# Patient Record
Sex: Female | Born: 1940 | Race: Black or African American | Hispanic: No | Smoking: Never smoker
Health system: Southern US, Community
[De-identification: ages and names within clinical notes are randomized; demographics above are authoritative.]

## PROBLEM LIST (undated history)

## (undated) HISTORY — PX: DIAGNOSTIC LAPAROSCOPY: SUR761

---

## 2013-10-16 ENCOUNTER — Inpatient Hospital Stay
Admission: AD | Admit: 2013-10-16 | Discharge: 2013-11-29 | Disposition: A | Discharge status: Other Acute Inpatient Hospital | Payer: Medicare Other | Source: Ambulatory Visit | Attending: Internal Medicine | Admitting: Internal Medicine

## 2013-10-16 ENCOUNTER — Other Ambulatory Visit (HOSPITAL_COMMUNITY): Payer: Medicare Other

## 2013-10-17 LAB — HEPATIC FUNCTION PANEL
ALT: 15 U/L (ref 0–35)
AST: 20 U/L (ref 0–37)
Albumin: 1.2 g/dL — ABNORMAL LOW (ref 3.5–5.2)
Bilirubin, Direct: 0.5 mg/dL — ABNORMAL HIGH (ref 0.0–0.3)
Total Protein: 5.9 g/dL — ABNORMAL LOW (ref 6.0–8.3)

## 2013-10-17 LAB — CBC WITH DIFFERENTIAL/PLATELET
Basophils Absolute: 0 10*3/uL (ref 0.0–0.1)
Basophils Relative: 0 % (ref 0–1)
Eosinophils Relative: 3 % (ref 0–5)
Hemoglobin: 8.4 g/dL — ABNORMAL LOW (ref 12.0–15.0)
Monocytes Absolute: 1 10*3/uL (ref 0.1–1.0)
Monocytes Relative: 7 % (ref 3–12)
Neutrophils Relative %: 79 % — ABNORMAL HIGH (ref 43–77)
Platelets: 213 10*3/uL (ref 150–400)
RBC: 2.81 MIL/uL — ABNORMAL LOW (ref 3.87–5.11)
WBC: 14.8 10*3/uL — ABNORMAL HIGH (ref 4.0–10.5)

## 2013-10-17 LAB — BASIC METABOLIC PANEL
CO2: 19 mEq/L (ref 19–32)
Calcium: 8.3 mg/dL — ABNORMAL LOW (ref 8.4–10.5)
GFR calc non Af Amer: >90 mL/min (ref >90)
Glucose, Bld: 51 mg/dL — ABNORMAL LOW (ref 70–99)
Potassium: 4.4 mEq/L (ref 3.7–5.3)
Sodium: 136 mEq/L — ABNORMAL LOW (ref 137–147)

## 2013-10-17 LAB — TSH: TSH: 2.505 u[IU]/mL (ref 0.350–4.500)

## 2013-10-17 LAB — C-REACTIVE PROTEIN: CRP: 12.8 mg/dL — ABNORMAL HIGH (ref <0.60)

## 2013-10-18 LAB — PTH, INTACT AND CALCIUM
Calcium, Total (PTH): 7.8 mg/dL — ABNORMAL LOW (ref 8.4–10.5)
PTH: 11.4 pg/mL — ABNORMAL LOW (ref 14.0–72.0)

## 2013-10-20 LAB — CBC
HEMATOCRIT: 24.6 % — AB (ref 36.0–46.0)
HEMOGLOBIN: 7.8 g/dL — AB (ref 12.0–15.0)
MCH: 28.3 pg (ref 26.0–34.0)
MCHC: 31.7 g/dL (ref 30.0–36.0)
MCV: 89.1 fL (ref 78.0–100.0)
Platelets: 349 10*3/uL (ref 150–400)
RBC: 2.76 MIL/uL — ABNORMAL LOW (ref 3.87–5.11)
RDW: 15.7 % — ABNORMAL HIGH (ref 11.5–15.5)
WBC: 15.5 10*3/uL — ABNORMAL HIGH (ref 4.0–10.5)

## 2013-10-20 LAB — BASIC METABOLIC PANEL
BUN: 6 mg/dL (ref 6–23)
CO2: 21 mEq/L (ref 19–32)
CREATININE: 0.43 mg/dL — AB (ref 0.50–1.10)
Calcium: 8.1 mg/dL — ABNORMAL LOW (ref 8.4–10.5)
Chloride: 102 mEq/L (ref 96–112)
GFR calc non Af Amer: >90 mL/min (ref >90)
GLUCOSE: 136 mg/dL — AB (ref 70–99)
Potassium: 3.6 mEq/L — ABNORMAL LOW (ref 3.7–5.3)
Sodium: 135 mEq/L — ABNORMAL LOW (ref 137–147)

## 2013-10-22 LAB — CBC
HEMATOCRIT: 24.7 % — AB (ref 36.0–46.0)
Hemoglobin: 7.9 g/dL — ABNORMAL LOW (ref 12.0–15.0)
MCH: 28.9 pg (ref 26.0–34.0)
MCHC: 32 g/dL (ref 30.0–36.0)
MCV: 90.5 fL (ref 78.0–100.0)
Platelets: 407 10*3/uL — ABNORMAL HIGH (ref 150–400)
RBC: 2.73 MIL/uL — ABNORMAL LOW (ref 3.87–5.11)
RDW: 15.9 % — AB (ref 11.5–15.5)
WBC: 14.2 10*3/uL — ABNORMAL HIGH (ref 4.0–10.5)

## 2013-10-24 LAB — COMPREHENSIVE METABOLIC PANEL
ALBUMIN: 1.6 g/dL — AB (ref 3.5–5.2)
ALT: 10 U/L (ref 0–35)
AST: 8 U/L (ref 0–37)
Alkaline Phosphatase: 135 U/L — ABNORMAL HIGH (ref 39–117)
BUN: 10 mg/dL (ref 6–23)
CALCIUM: 8.7 mg/dL (ref 8.4–10.5)
CO2: 22 meq/L (ref 19–32)
CREATININE: 0.44 mg/dL — AB (ref 0.50–1.10)
Chloride: 100 mEq/L (ref 96–112)
GFR calc Af Amer: >90 mL/min (ref >90)
Glucose, Bld: 179 mg/dL — ABNORMAL HIGH (ref 70–99)
Potassium: 3.7 mEq/L (ref 3.7–5.3)
SODIUM: 136 meq/L — AB (ref 137–147)
Total Bilirubin: 0.4 mg/dL (ref 0.3–1.2)
Total Protein: 6.6 g/dL (ref 6.0–8.3)

## 2013-10-24 LAB — CBC
HCT: 27.5 % — ABNORMAL LOW (ref 36.0–46.0)
Hemoglobin: 8.7 g/dL — ABNORMAL LOW (ref 12.0–15.0)
MCH: 28.6 pg (ref 26.0–34.0)
MCHC: 31.6 g/dL (ref 30.0–36.0)
MCV: 90.5 fL (ref 78.0–100.0)
PLATELETS: 478 10*3/uL — AB (ref 150–400)
RBC: 3.04 MIL/uL — AB (ref 3.87–5.11)
RDW: 16 % — ABNORMAL HIGH (ref 11.5–15.5)
WBC: 12.6 10*3/uL — ABNORMAL HIGH (ref 4.0–10.5)

## 2013-10-25 ENCOUNTER — Other Ambulatory Visit (HOSPITAL_COMMUNITY): Payer: Medicare Other

## 2013-10-26 LAB — CBC WITH DIFFERENTIAL/PLATELET
Basophils Absolute: 0 10*3/uL (ref 0.0–0.1)
Basophils Relative: 0 % (ref 0–1)
EOS ABS: 0 10*3/uL (ref 0.0–0.7)
Eosinophils Relative: 0 % (ref 0–5)
HCT: 27.2 % — ABNORMAL LOW (ref 36.0–46.0)
HEMOGLOBIN: 8.8 g/dL — AB (ref 12.0–15.0)
LYMPHS ABS: 1.1 10*3/uL (ref 0.7–4.0)
Lymphocytes Relative: 9 % — ABNORMAL LOW (ref 12–46)
MCH: 29.4 pg (ref 26.0–34.0)
MCHC: 32.4 g/dL (ref 30.0–36.0)
MCV: 91 fL (ref 78.0–100.0)
MONO ABS: 0.4 10*3/uL (ref 0.1–1.0)
MONOS PCT: 3 % (ref 3–12)
Neutro Abs: 10.8 10*3/uL — ABNORMAL HIGH (ref 1.7–7.7)
Neutrophils Relative %: 88 % — ABNORMAL HIGH (ref 43–77)
Platelets: 452 10*3/uL — ABNORMAL HIGH (ref 150–400)
RBC: 2.99 MIL/uL — ABNORMAL LOW (ref 3.87–5.11)
RDW: 16 % — ABNORMAL HIGH (ref 11.5–15.5)
WBC: 12.2 10*3/uL — ABNORMAL HIGH (ref 4.0–10.5)

## 2013-10-26 LAB — BASIC METABOLIC PANEL
BUN: 16 mg/dL (ref 6–23)
CO2: 22 mEq/L (ref 19–32)
CREATININE: 0.45 mg/dL — AB (ref 0.50–1.10)
Calcium: 8.7 mg/dL (ref 8.4–10.5)
Chloride: 100 mEq/L (ref 96–112)
GLUCOSE: 113 mg/dL — AB (ref 70–99)
POTASSIUM: 4.7 meq/L (ref 3.7–5.3)
Sodium: 135 mEq/L — ABNORMAL LOW (ref 137–147)

## 2013-10-28 LAB — CBC
HCT: 33 % — ABNORMAL LOW (ref 36.0–46.0)
HEMOGLOBIN: 10.6 g/dL — AB (ref 12.0–15.0)
MCH: 29 pg (ref 26.0–34.0)
MCHC: 32.1 g/dL (ref 30.0–36.0)
MCV: 90.4 fL (ref 78.0–100.0)
Platelets: 484 10*3/uL — ABNORMAL HIGH (ref 150–400)
RBC: 3.65 MIL/uL — ABNORMAL LOW (ref 3.87–5.11)
RDW: 16.1 % — AB (ref 11.5–15.5)
WBC: 16.8 10*3/uL — ABNORMAL HIGH (ref 4.0–10.5)

## 2013-10-28 LAB — BASIC METABOLIC PANEL
BUN: 16 mg/dL (ref 6–23)
CALCIUM: 8.5 mg/dL (ref 8.4–10.5)
CO2: 22 mEq/L (ref 19–32)
Chloride: 99 mEq/L (ref 96–112)
Creatinine, Ser: 0.44 mg/dL — ABNORMAL LOW (ref 0.50–1.10)
GLUCOSE: 118 mg/dL — AB (ref 70–99)
POTASSIUM: 3.4 meq/L — AB (ref 3.7–5.3)
Sodium: 135 mEq/L — ABNORMAL LOW (ref 137–147)

## 2013-10-28 MED ORDER — IOHEXOL 300 MG/ML  SOLN: 25.0000 mL | Freq: Once | INTRAMUSCULAR | Status: AC | PRN

## 2013-10-29 ENCOUNTER — Other Ambulatory Visit (HOSPITAL_COMMUNITY): Payer: Medicare Other

## 2013-10-29 ENCOUNTER — Encounter: Payer: Self-pay | Admitting: Radiology

## 2013-10-29 LAB — CBC WITH DIFFERENTIAL/PLATELET
BASOS ABS: 0 10*3/uL (ref 0.0–0.1)
BASOS PCT: 0 % (ref 0–1)
EOS PCT: 0 % (ref 0–5)
Eosinophils Absolute: 0 10*3/uL (ref 0.0–0.7)
HCT: 33.1 % — ABNORMAL LOW (ref 36.0–46.0)
Hemoglobin: 10.5 g/dL — ABNORMAL LOW (ref 12.0–15.0)
Lymphocytes Relative: 8 % — ABNORMAL LOW (ref 12–46)
Lymphs Abs: 1.5 10*3/uL (ref 0.7–4.0)
MCH: 29 pg (ref 26.0–34.0)
MCHC: 31.7 g/dL (ref 30.0–36.0)
MCV: 91.4 fL (ref 78.0–100.0)
Monocytes Absolute: 1 10*3/uL (ref 0.1–1.0)
Monocytes Relative: 5 % (ref 3–12)
NEUTROS PCT: 87 % — AB (ref 43–77)
Neutro Abs: 15.7 10*3/uL — ABNORMAL HIGH (ref 1.7–7.7)
Platelets: 451 10*3/uL — ABNORMAL HIGH (ref 150–400)
RBC: 3.62 MIL/uL — ABNORMAL LOW (ref 3.87–5.11)
RDW: 16.5 % — AB (ref 11.5–15.5)
WBC: 18.2 10*3/uL — AB (ref 4.0–10.5)

## 2013-10-29 LAB — POTASSIUM: Potassium: 3.8 mEq/L (ref 3.7–5.3)

## 2013-10-29 LAB — PROCALCITONIN

## 2013-10-29 MED ORDER — IOHEXOL 300 MG/ML  SOLN
80.0000 mL | Freq: Once | INTRAMUSCULAR | Status: AC | PRN
  Administered 2013-10-29: 80 mL via INTRAVENOUS

## 2013-10-30 LAB — COMPREHENSIVE METABOLIC PANEL
ALT: 21 U/L (ref 0–35)
AST: 12 U/L (ref 0–37)
Albumin: 2.1 g/dL — ABNORMAL LOW (ref 3.5–5.2)
Alkaline Phosphatase: 80 U/L (ref 39–117)
BILIRUBIN TOTAL: 0.3 mg/dL (ref 0.3–1.2)
BUN: 15 mg/dL (ref 6–23)
CO2: 22 meq/L (ref 19–32)
CREATININE: 0.42 mg/dL — AB (ref 0.50–1.10)
Calcium: 8.1 mg/dL — ABNORMAL LOW (ref 8.4–10.5)
Chloride: 99 mEq/L (ref 96–112)
GLUCOSE: 128 mg/dL — AB (ref 70–99)
Potassium: 4 mEq/L (ref 3.7–5.3)
Sodium: 134 mEq/L — ABNORMAL LOW (ref 137–147)
Total Protein: 6.2 g/dL (ref 6.0–8.3)

## 2013-10-30 LAB — CBC WITH DIFFERENTIAL/PLATELET
BASOS ABS: 0 10*3/uL (ref 0.0–0.1)
BASOS PCT: 0 % (ref 0–1)
EOS ABS: 0 10*3/uL (ref 0.0–0.7)
Eosinophils Relative: 0 % (ref 0–5)
HCT: 30.1 % — ABNORMAL LOW (ref 36.0–46.0)
Hemoglobin: 9.9 g/dL — ABNORMAL LOW (ref 12.0–15.0)
Lymphocytes Relative: 5 % — ABNORMAL LOW (ref 12–46)
Lymphs Abs: 0.7 10*3/uL (ref 0.7–4.0)
MCH: 29.6 pg (ref 26.0–34.0)
MCHC: 32.9 g/dL (ref 30.0–36.0)
MCV: 90.1 fL (ref 78.0–100.0)
Monocytes Absolute: 0.4 10*3/uL (ref 0.1–1.0)
Monocytes Relative: 3 % (ref 3–12)
Neutro Abs: 13.8 10*3/uL — ABNORMAL HIGH (ref 1.7–7.7)
Neutrophils Relative %: 93 % — ABNORMAL HIGH (ref 43–77)
PLATELETS: 427 10*3/uL — AB (ref 150–400)
RBC: 3.34 MIL/uL — ABNORMAL LOW (ref 3.87–5.11)
RDW: 16.7 % — ABNORMAL HIGH (ref 11.5–15.5)
WBC: 14.9 10*3/uL — ABNORMAL HIGH (ref 4.0–10.5)

## 2013-10-30 LAB — PREALBUMIN: Prealbumin: 27.5 mg/dL (ref 17.0–34.0)

## 2013-11-01 ENCOUNTER — Other Ambulatory Visit (HOSPITAL_COMMUNITY): Payer: Medicare Other

## 2013-11-01 LAB — CBC WITH DIFFERENTIAL/PLATELET
BASOS ABS: 0 10*3/uL (ref 0.0–0.1)
Basophils Relative: 0 % (ref 0–1)
EOS ABS: 0 10*3/uL (ref 0.0–0.7)
Eosinophils Relative: 0 % (ref 0–5)
HCT: 31.8 % — ABNORMAL LOW (ref 36.0–46.0)
Hemoglobin: 10.4 g/dL — ABNORMAL LOW (ref 12.0–15.0)
LYMPHS ABS: 0.8 10*3/uL (ref 0.7–4.0)
Lymphocytes Relative: 4 % — ABNORMAL LOW (ref 12–46)
MCH: 29.5 pg (ref 26.0–34.0)
MCHC: 32.7 g/dL (ref 30.0–36.0)
MCV: 90.3 fL (ref 78.0–100.0)
Monocytes Absolute: 0.8 10*3/uL (ref 0.1–1.0)
Monocytes Relative: 4 % (ref 3–12)
NEUTROS PCT: 92 % — AB (ref 43–77)
Neutro Abs: 17.7 10*3/uL — ABNORMAL HIGH (ref 1.7–7.7)
PLATELETS: 352 10*3/uL (ref 150–400)
RBC: 3.52 MIL/uL — ABNORMAL LOW (ref 3.87–5.11)
RDW: 17.1 % — AB (ref 11.5–15.5)
WBC: 19.3 10*3/uL — AB (ref 4.0–10.5)

## 2013-11-01 LAB — BASIC METABOLIC PANEL
BUN: 17 mg/dL (ref 6–23)
CALCIUM: 8.2 mg/dL — AB (ref 8.4–10.5)
CO2: 22 mEq/L (ref 19–32)
Chloride: 98 mEq/L (ref 96–112)
Creatinine, Ser: 0.39 mg/dL — ABNORMAL LOW (ref 0.50–1.10)
GFR calc Af Amer: >90 mL/min (ref >90)
GLUCOSE: 168 mg/dL — AB (ref 70–99)
POTASSIUM: 3.5 meq/L — AB (ref 3.7–5.3)
SODIUM: 133 meq/L — AB (ref 137–147)

## 2013-11-02 ENCOUNTER — Other Ambulatory Visit (HOSPITAL_COMMUNITY): Payer: Medicare Other

## 2013-11-02 LAB — CBC
HCT: 35.6 % — ABNORMAL LOW (ref 36.0–46.0)
HEMOGLOBIN: 11.2 g/dL — AB (ref 12.0–15.0)
MCH: 29.2 pg (ref 26.0–34.0)
MCHC: 31.5 g/dL (ref 30.0–36.0)
MCV: 92.7 fL (ref 78.0–100.0)
Platelets: 317 10*3/uL (ref 150–400)
RBC: 3.84 MIL/uL — AB (ref 3.87–5.11)
RDW: 17.2 % — ABNORMAL HIGH (ref 11.5–15.5)
WBC: 23.3 10*3/uL — ABNORMAL HIGH (ref 4.0–10.5)

## 2013-11-02 LAB — BASIC METABOLIC PANEL
BUN: 16 mg/dL (ref 6–23)
CALCIUM: 8.4 mg/dL (ref 8.4–10.5)
CHLORIDE: 100 meq/L (ref 96–112)
CO2: 24 meq/L (ref 19–32)
Creatinine, Ser: 0.39 mg/dL — ABNORMAL LOW (ref 0.50–1.10)
GFR calc Af Amer: >90 mL/min (ref >90)
GFR calc non Af Amer: >90 mL/min (ref >90)
GLUCOSE: 109 mg/dL — AB (ref 70–99)
Potassium: 4.1 mEq/L (ref 3.7–5.3)
SODIUM: 136 meq/L — AB (ref 137–147)

## 2013-11-03 ENCOUNTER — Other Ambulatory Visit (HOSPITAL_COMMUNITY): Payer: Medicare Other

## 2013-11-03 LAB — CBC WITH DIFFERENTIAL/PLATELET
Basophils Absolute: 0 10*3/uL (ref 0.0–0.1)
Basophils Relative: 0 % (ref 0–1)
EOS ABS: 0 10*3/uL (ref 0.0–0.7)
EOS PCT: 0 % (ref 0–5)
HCT: 35.5 % — ABNORMAL LOW (ref 36.0–46.0)
Hemoglobin: 11.4 g/dL — ABNORMAL LOW (ref 12.0–15.0)
LYMPHS ABS: 1 10*3/uL (ref 0.7–4.0)
LYMPHS PCT: 4 % — AB (ref 12–46)
MCH: 29.2 pg (ref 26.0–34.0)
MCHC: 32.1 g/dL (ref 30.0–36.0)
MCV: 91 fL (ref 78.0–100.0)
MONOS PCT: 3 % (ref 3–12)
Monocytes Absolute: 0.7 10*3/uL (ref 0.1–1.0)
Neutro Abs: 22.4 10*3/uL — ABNORMAL HIGH (ref 1.7–7.7)
Neutrophils Relative %: 93 % — ABNORMAL HIGH (ref 43–77)
PLATELETS: 280 10*3/uL (ref 150–400)
RBC: 3.9 MIL/uL (ref 3.87–5.11)
RDW: 16.9 % — ABNORMAL HIGH (ref 11.5–15.5)
WBC: 24.1 10*3/uL — ABNORMAL HIGH (ref 4.0–10.5)

## 2013-11-03 LAB — URINE MICROSCOPIC-ADD ON

## 2013-11-03 LAB — URINALYSIS, ROUTINE W REFLEX MICROSCOPIC
BILIRUBIN URINE: NEGATIVE
GLUCOSE, UA: NEGATIVE mg/dL
HGB URINE DIPSTICK: NEGATIVE
Ketones, ur: NEGATIVE mg/dL
Nitrite: NEGATIVE
PROTEIN: NEGATIVE mg/dL
Specific Gravity, Urine: 1.018 (ref 1.005–1.030)
Urobilinogen, UA: 0.2 mg/dL (ref 0.0–1.0)
pH: 7.5 (ref 5.0–8.0)

## 2013-11-03 LAB — BASIC METABOLIC PANEL
BUN: 14 mg/dL (ref 6–23)
CALCIUM: 8.5 mg/dL (ref 8.4–10.5)
CO2: 20 meq/L (ref 19–32)
Chloride: 100 mEq/L (ref 96–112)
Creatinine, Ser: 0.41 mg/dL — ABNORMAL LOW (ref 0.50–1.10)
GFR calc Af Amer: >90 mL/min (ref >90)
GLUCOSE: 141 mg/dL — AB (ref 70–99)
Potassium: 4 mEq/L (ref 3.7–5.3)
SODIUM: 134 meq/L — AB (ref 137–147)

## 2013-11-03 LAB — CLOSTRIDIUM DIFFICILE BY PCR: CDIFFPCR: POSITIVE — AB

## 2013-11-03 MED ORDER — IOHEXOL 300 MG/ML  SOLN
80.0000 mL | Freq: Once | INTRAMUSCULAR | Status: AC | PRN
  Administered 2013-11-03: 80 mL via INTRAVENOUS

## 2013-11-04 LAB — CBC
HCT: 38.3 % (ref 36.0–46.0)
Hemoglobin: 12.4 g/dL (ref 12.0–15.0)
MCH: 29.8 pg (ref 26.0–34.0)
MCHC: 32.4 g/dL (ref 30.0–36.0)
MCV: 92.1 fL (ref 78.0–100.0)
Platelets: 264 10*3/uL (ref 150–400)
RBC: 4.16 MIL/uL (ref 3.87–5.11)
RDW: 17 % — ABNORMAL HIGH (ref 11.5–15.5)
WBC: 27.6 10*3/uL — AB (ref 4.0–10.5)

## 2013-11-04 LAB — BASIC METABOLIC PANEL
BUN: 14 mg/dL (ref 6–23)
CHLORIDE: 99 meq/L (ref 96–112)
CO2: 24 meq/L (ref 19–32)
CREATININE: 0.45 mg/dL — AB (ref 0.50–1.10)
Calcium: 8.9 mg/dL (ref 8.4–10.5)
GFR calc Af Amer: >90 mL/min (ref >90)
GFR calc non Af Amer: >90 mL/min (ref >90)
Glucose, Bld: 120 mg/dL — ABNORMAL HIGH (ref 70–99)
Potassium: 4 mEq/L (ref 3.7–5.3)
Sodium: 136 mEq/L — ABNORMAL LOW (ref 137–147)

## 2013-11-04 LAB — URINE CULTURE: Colony Count: >=100000

## 2013-11-06 LAB — CBC WITH DIFFERENTIAL/PLATELET
BASOS ABS: 0 10*3/uL (ref 0.0–0.1)
Basophils Relative: 0 % (ref 0–1)
Eosinophils Absolute: 0 10*3/uL (ref 0.0–0.7)
Eosinophils Relative: 0 % (ref 0–5)
HCT: 35.2 % — ABNORMAL LOW (ref 36.0–46.0)
Hemoglobin: 11.5 g/dL — ABNORMAL LOW (ref 12.0–15.0)
LYMPHS ABS: 0.8 10*3/uL (ref 0.7–4.0)
Lymphocytes Relative: 3 % — ABNORMAL LOW (ref 12–46)
MCH: 29.6 pg (ref 26.0–34.0)
MCHC: 32.7 g/dL (ref 30.0–36.0)
MCV: 90.5 fL (ref 78.0–100.0)
Monocytes Absolute: 0.7 10*3/uL (ref 0.1–1.0)
Monocytes Relative: 3 % (ref 3–12)
NEUTROS ABS: 22 10*3/uL — AB (ref 1.7–7.7)
NEUTROS PCT: 94 % — AB (ref 43–77)
Platelets: 210 10*3/uL (ref 150–400)
RBC: 3.89 MIL/uL (ref 3.87–5.11)
RDW: 16.8 % — ABNORMAL HIGH (ref 11.5–15.5)
WBC: 23.5 10*3/uL — AB (ref 4.0–10.5)

## 2013-11-06 LAB — BASIC METABOLIC PANEL
BUN: 19 mg/dL (ref 6–23)
CO2: 19 mEq/L (ref 19–32)
Calcium: 9 mg/dL (ref 8.4–10.5)
Chloride: 99 mEq/L (ref 96–112)
Creatinine, Ser: 0.39 mg/dL — ABNORMAL LOW (ref 0.50–1.10)
GFR calc Af Amer: >90 mL/min (ref >90)
GFR calc non Af Amer: >90 mL/min (ref >90)
GLUCOSE: 168 mg/dL — AB (ref 70–99)
POTASSIUM: 3.6 meq/L — AB (ref 3.7–5.3)
SODIUM: 136 meq/L — AB (ref 137–147)

## 2013-11-07 LAB — CBC
HCT: 36.9 % (ref 36.0–46.0)
HEMOGLOBIN: 11.9 g/dL — AB (ref 12.0–15.0)
MCH: 29.5 pg (ref 26.0–34.0)
MCHC: 32.2 g/dL (ref 30.0–36.0)
MCV: 91.6 fL (ref 78.0–100.0)
Platelets: 191 10*3/uL (ref 150–400)
RBC: 4.03 MIL/uL (ref 3.87–5.11)
RDW: 16.9 % — ABNORMAL HIGH (ref 11.5–15.5)
WBC: 20.9 10*3/uL — AB (ref 4.0–10.5)

## 2013-11-07 LAB — BASIC METABOLIC PANEL
BUN: 18 mg/dL (ref 6–23)
CALCIUM: 9.2 mg/dL (ref 8.4–10.5)
CO2: 23 meq/L (ref 19–32)
CREATININE: 0.44 mg/dL — AB (ref 0.50–1.10)
Chloride: 102 mEq/L (ref 96–112)
GFR calc Af Amer: >90 mL/min (ref >90)
GFR calc non Af Amer: >90 mL/min (ref >90)
GLUCOSE: 89 mg/dL (ref 70–99)
Potassium: 2.9 mEq/L — CL (ref 3.7–5.3)
SODIUM: 140 meq/L (ref 137–147)

## 2013-11-07 LAB — MAGNESIUM: Magnesium: 2.2 mg/dL (ref 1.5–2.5)

## 2013-11-07 LAB — PHOSPHORUS: PHOSPHORUS: 2.4 mg/dL (ref 2.3–4.6)

## 2013-11-08 LAB — BASIC METABOLIC PANEL
BUN: 12 mg/dL (ref 6–23)
CHLORIDE: 103 meq/L (ref 96–112)
CO2: 22 mEq/L (ref 19–32)
Calcium: 10 mg/dL (ref 8.4–10.5)
Creatinine, Ser: 0.39 mg/dL — ABNORMAL LOW (ref 0.50–1.10)
GFR calc non Af Amer: >90 mL/min (ref >90)
Glucose, Bld: 134 mg/dL — ABNORMAL HIGH (ref 70–99)
POTASSIUM: 3.6 meq/L — AB (ref 3.7–5.3)
Sodium: 138 mEq/L (ref 137–147)

## 2013-11-09 ENCOUNTER — Other Ambulatory Visit (HOSPITAL_COMMUNITY): Payer: Medicare Other

## 2013-11-09 LAB — CBC
HEMATOCRIT: 37.2 % (ref 36.0–46.0)
HEMOGLOBIN: 11.7 g/dL — AB (ref 12.0–15.0)
MCH: 29.5 pg (ref 26.0–34.0)
MCHC: 31.5 g/dL (ref 30.0–36.0)
MCV: 93.7 fL (ref 78.0–100.0)
Platelets: 188 10*3/uL (ref 150–400)
RBC: 3.97 MIL/uL (ref 3.87–5.11)
RDW: 17.3 % — ABNORMAL HIGH (ref 11.5–15.5)
WBC: 21.9 10*3/uL — AB (ref 4.0–10.5)

## 2013-11-09 LAB — BASIC METABOLIC PANEL
BUN: 14 mg/dL (ref 6–23)
CHLORIDE: 103 meq/L (ref 96–112)
CO2: 22 meq/L (ref 19–32)
Calcium: 11.3 mg/dL — ABNORMAL HIGH (ref 8.4–10.5)
Creatinine, Ser: 0.42 mg/dL — ABNORMAL LOW (ref 0.50–1.10)
GFR calc Af Amer: >90 mL/min (ref >90)
GFR calc non Af Amer: >90 mL/min (ref >90)
Glucose, Bld: 145 mg/dL — ABNORMAL HIGH (ref 70–99)
Potassium: 3.7 mEq/L (ref 3.7–5.3)
SODIUM: 138 meq/L (ref 137–147)

## 2013-11-10 LAB — CBC WITH DIFFERENTIAL/PLATELET
BASOS ABS: 0 10*3/uL (ref 0.0–0.1)
BASOS PCT: 0 % (ref 0–1)
EOS PCT: 3 % (ref 0–5)
Eosinophils Absolute: 0.5 10*3/uL (ref 0.0–0.7)
HEMATOCRIT: 34.6 % — AB (ref 36.0–46.0)
HEMOGLOBIN: 10.9 g/dL — AB (ref 12.0–15.0)
Lymphocytes Relative: 12 % (ref 12–46)
Lymphs Abs: 2.2 10*3/uL (ref 0.7–4.0)
MCH: 29.2 pg (ref 26.0–34.0)
MCHC: 31.5 g/dL (ref 30.0–36.0)
MCV: 92.8 fL (ref 78.0–100.0)
MONO ABS: 0.6 10*3/uL (ref 0.1–1.0)
MONOS PCT: 4 % (ref 3–12)
Neutro Abs: 14.7 10*3/uL — ABNORMAL HIGH (ref 1.7–7.7)
Neutrophils Relative %: 82 % — ABNORMAL HIGH (ref 43–77)
Platelets: 140 10*3/uL — ABNORMAL LOW (ref 150–400)
RBC: 3.73 MIL/uL — ABNORMAL LOW (ref 3.87–5.11)
RDW: 17.1 % — AB (ref 11.5–15.5)
WBC: 18 10*3/uL — ABNORMAL HIGH (ref 4.0–10.5)

## 2013-11-10 LAB — BASIC METABOLIC PANEL
BUN: 12 mg/dL (ref 6–23)
CALCIUM: 10.2 mg/dL (ref 8.4–10.5)
CHLORIDE: 109 meq/L (ref 96–112)
CO2: 17 mEq/L — ABNORMAL LOW (ref 19–32)
CREATININE: 0.37 mg/dL — AB (ref 0.50–1.10)
GFR calc non Af Amer: >90 mL/min (ref >90)
Glucose, Bld: 106 mg/dL — ABNORMAL HIGH (ref 70–99)
Potassium: 3.5 mEq/L — ABNORMAL LOW (ref 3.7–5.3)
Sodium: 139 mEq/L (ref 137–147)

## 2013-11-10 LAB — PROCALCITONIN

## 2013-11-11 LAB — BASIC METABOLIC PANEL
BUN: 7 mg/dL (ref 6–23)
CALCIUM: 10 mg/dL (ref 8.4–10.5)
CO2: 22 meq/L (ref 19–32)
CREATININE: 0.39 mg/dL — AB (ref 0.50–1.10)
Chloride: 104 mEq/L (ref 96–112)
GFR calc non Af Amer: >90 mL/min (ref >90)
Glucose, Bld: 128 mg/dL — ABNORMAL HIGH (ref 70–99)
Potassium: 2.6 mEq/L — CL (ref 3.7–5.3)
Sodium: 136 mEq/L — ABNORMAL LOW (ref 137–147)

## 2013-11-12 LAB — POTASSIUM: Potassium: 3.2 mEq/L — ABNORMAL LOW (ref 3.7–5.3)

## 2013-11-12 LAB — MAGNESIUM: Magnesium: 1.9 mg/dL (ref 1.5–2.5)

## 2013-11-13 ENCOUNTER — Other Ambulatory Visit (HOSPITAL_COMMUNITY): Payer: Medicare Other

## 2013-11-13 ENCOUNTER — Other Ambulatory Visit (HOSPITAL_COMMUNITY): Payer: Self-pay

## 2013-11-13 LAB — TROPONIN I

## 2013-11-13 LAB — COMPREHENSIVE METABOLIC PANEL
ALBUMIN: 1.8 g/dL — AB (ref 3.5–5.2)
ALT: 9 U/L (ref 0–35)
AST: 11 U/L (ref 0–37)
Alkaline Phosphatase: 43 U/L (ref 39–117)
BILIRUBIN TOTAL: 0.2 mg/dL — AB (ref 0.3–1.2)
BUN: 11 mg/dL (ref 6–23)
CHLORIDE: 103 meq/L (ref 96–112)
CO2: 22 meq/L (ref 19–32)
Calcium: 10.9 mg/dL — ABNORMAL HIGH (ref 8.4–10.5)
Creatinine, Ser: 0.43 mg/dL — ABNORMAL LOW (ref 0.50–1.10)
GFR calc Af Amer: >90 mL/min (ref >90)
GFR calc non Af Amer: >90 mL/min (ref >90)
Glucose, Bld: 129 mg/dL — ABNORMAL HIGH (ref 70–99)
Potassium: 3.8 mEq/L (ref 3.7–5.3)
Sodium: 136 mEq/L — ABNORMAL LOW (ref 137–147)
Total Protein: 5.6 g/dL — ABNORMAL LOW (ref 6.0–8.3)

## 2013-11-13 LAB — CBC WITH DIFFERENTIAL/PLATELET
BASOS ABS: 0 10*3/uL (ref 0.0–0.1)
BASOS PCT: 0 % (ref 0–1)
Eosinophils Absolute: 0.5 10*3/uL (ref 0.0–0.7)
Eosinophils Relative: 4 % (ref 0–5)
HCT: 33 % — ABNORMAL LOW (ref 36.0–46.0)
HEMOGLOBIN: 10.5 g/dL — AB (ref 12.0–15.0)
LYMPHS PCT: 15 % (ref 12–46)
Lymphs Abs: 2 10*3/uL (ref 0.7–4.0)
MCH: 29.4 pg (ref 26.0–34.0)
MCHC: 31.8 g/dL (ref 30.0–36.0)
MCV: 92.4 fL (ref 78.0–100.0)
Monocytes Absolute: 0.6 10*3/uL (ref 0.1–1.0)
Monocytes Relative: 4 % (ref 3–12)
NEUTROS PCT: 78 % — AB (ref 43–77)
Neutro Abs: 10.7 10*3/uL — ABNORMAL HIGH (ref 1.7–7.7)
Platelets: 155 10*3/uL (ref 150–400)
RBC: 3.57 MIL/uL — ABNORMAL LOW (ref 3.87–5.11)
RDW: 16.8 % — ABNORMAL HIGH (ref 11.5–15.5)
WBC: 13.7 10*3/uL — AB (ref 4.0–10.5)

## 2013-11-13 LAB — CK TOTAL AND CKMB (NOT AT ARMC)
CK TOTAL: 10 U/L (ref 7–177)
CK, MB: 1.1 ng/mL (ref 0.3–4.0)
CK, MB: 0.8 ng/mL (ref 0.3–4.0)
Relative Index: INVALID (ref 0.0–2.5)
Relative Index: INVALID (ref 0.0–2.5)
Total CK: 11 U/L (ref 7–177)

## 2013-11-13 LAB — TSH: TSH: 1.416 u[IU]/mL (ref 0.350–4.500)

## 2013-11-13 LAB — CK
CK TOTAL: 11 U/L (ref 7–177)
Total CK: 13 U/L (ref 7–177)

## 2013-11-13 LAB — T3: T3, Total: 99.4 ng/dl (ref 80.0–204.0)

## 2013-11-13 LAB — PREALBUMIN: Prealbumin: 8.7 mg/dL — ABNORMAL LOW (ref 17.0–34.0)

## 2013-11-13 LAB — T4, FREE: Free T4: 1.1 ng/dL (ref 0.80–1.80)

## 2013-11-14 LAB — CBC WITH DIFFERENTIAL/PLATELET
Basophils Absolute: 0 10*3/uL (ref 0.0–0.1)
Basophils Relative: 0 % (ref 0–1)
EOS ABS: 0.5 10*3/uL (ref 0.0–0.7)
Eosinophils Relative: 4 % (ref 0–5)
HCT: 31.6 % — ABNORMAL LOW (ref 36.0–46.0)
HEMOGLOBIN: 10.2 g/dL — AB (ref 12.0–15.0)
Lymphocytes Relative: 16 % (ref 12–46)
Lymphs Abs: 1.9 10*3/uL (ref 0.7–4.0)
MCH: 29.7 pg (ref 26.0–34.0)
MCHC: 32.3 g/dL (ref 30.0–36.0)
MCV: 91.9 fL (ref 78.0–100.0)
MONOS PCT: 7 % (ref 3–12)
Monocytes Absolute: 0.8 10*3/uL (ref 0.1–1.0)
Neutro Abs: 9 10*3/uL — ABNORMAL HIGH (ref 1.7–7.7)
Neutrophils Relative %: 73 % (ref 43–77)
Platelets: 152 10*3/uL (ref 150–400)
RBC: 3.44 MIL/uL — AB (ref 3.87–5.11)
RDW: 16.8 % — ABNORMAL HIGH (ref 11.5–15.5)
WBC: 12.2 10*3/uL — ABNORMAL HIGH (ref 4.0–10.5)

## 2013-11-14 LAB — BASIC METABOLIC PANEL
BUN: 11 mg/dL (ref 6–23)
CO2: 20 mEq/L (ref 19–32)
Calcium: 11 mg/dL — ABNORMAL HIGH (ref 8.4–10.5)
Chloride: 103 mEq/L (ref 96–112)
Creatinine, Ser: 0.49 mg/dL — ABNORMAL LOW (ref 0.50–1.10)
GFR calc Af Amer: >90 mL/min (ref >90)
GLUCOSE: 127 mg/dL — AB (ref 70–99)
Potassium: 3.8 mEq/L (ref 3.7–5.3)
Sodium: 135 mEq/L — ABNORMAL LOW (ref 137–147)

## 2013-11-15 ENCOUNTER — Other Ambulatory Visit (HOSPITAL_COMMUNITY): Payer: Medicare Other

## 2013-11-15 DIAGNOSIS — I369 Nonrheumatic tricuspid valve disorder, unspecified: Secondary | ICD-10-CM

## 2013-11-15 NOTE — Progress Notes (Signed)
  Echocardiogram 2D Echocardiogram has been performed.  Heidi Hall 11/15/2013, 3:42 PM

## 2013-11-16 ENCOUNTER — Other Ambulatory Visit (HOSPITAL_COMMUNITY): Payer: Medicare Other

## 2013-11-17 ENCOUNTER — Other Ambulatory Visit (HOSPITAL_COMMUNITY): Payer: Medicare Other

## 2013-11-17 MED ORDER — IOHEXOL 300 MG/ML  SOLN
50.0000 mL | Freq: Once | INTRAMUSCULAR | Status: AC | PRN
  Administered 2013-11-17: 10 mL via INTRAVENOUS

## 2013-11-18 LAB — CBC
HCT: 37 % (ref 36.0–46.0)
Hemoglobin: 11.5 g/dL — ABNORMAL LOW (ref 12.0–15.0)
MCH: 29.6 pg (ref 26.0–34.0)
MCHC: 31.1 g/dL (ref 30.0–36.0)
MCV: 95.4 fL (ref 78.0–100.0)
PLATELETS: 218 10*3/uL (ref 150–400)
RBC: 3.88 MIL/uL (ref 3.87–5.11)
RDW: 16.5 % — ABNORMAL HIGH (ref 11.5–15.5)
WBC: 10.3 10*3/uL (ref 4.0–10.5)

## 2013-11-18 LAB — BASIC METABOLIC PANEL
BUN: 13 mg/dL (ref 6–23)
CALCIUM: 14.7 mg/dL — AB (ref 8.4–10.5)
CO2: 27 mEq/L (ref 19–32)
Chloride: 110 mEq/L (ref 96–112)
Creatinine, Ser: 0.56 mg/dL (ref 0.50–1.10)
GFR, EST NON AFRICAN AMERICAN: 90 mL/min — AB (ref >90)
Glucose, Bld: 109 mg/dL — ABNORMAL HIGH (ref 70–99)
Potassium: 2.8 mEq/L — CL (ref 3.7–5.3)
SODIUM: 148 meq/L — AB (ref 137–147)

## 2013-11-19 LAB — BASIC METABOLIC PANEL
BUN: 11 mg/dL (ref 6–23)
CHLORIDE: 110 meq/L (ref 96–112)
CO2: 20 mEq/L (ref 19–32)
CREATININE: 0.5 mg/dL (ref 0.50–1.10)
Calcium: 14.4 mg/dL (ref 8.4–10.5)
GFR calc non Af Amer: >90 mL/min (ref >90)
GLUCOSE: 86 mg/dL (ref 70–99)
Potassium: 3.6 mEq/L — ABNORMAL LOW (ref 3.7–5.3)
Sodium: 146 mEq/L (ref 137–147)

## 2013-11-19 LAB — CALCIUM, IONIZED: Calcium, Ion: 2.1 mmol/L (ref 1.13–1.30)

## 2013-11-20 ENCOUNTER — Other Ambulatory Visit (HOSPITAL_COMMUNITY): Payer: Medicare Other

## 2013-11-20 LAB — BASIC METABOLIC PANEL
BUN: 6 mg/dL (ref 6–23)
CALCIUM: 12.2 mg/dL — AB (ref 8.4–10.5)
CHLORIDE: 110 meq/L (ref 96–112)
CO2: 25 meq/L (ref 19–32)
Creatinine, Ser: 0.48 mg/dL — ABNORMAL LOW (ref 0.50–1.10)
GFR calc Af Amer: >90 mL/min (ref >90)
GFR calc non Af Amer: >90 mL/min (ref >90)
GLUCOSE: 175 mg/dL — AB (ref 70–99)
Potassium: 2.8 mEq/L — CL (ref 3.7–5.3)
SODIUM: 146 meq/L (ref 137–147)

## 2013-11-20 LAB — PTH, INTACT AND CALCIUM
Calcium, Total (PTH): 13.4 mg/dL (ref 8.4–10.5)
PTH: <2.5 pg/mL — ABNORMAL LOW (ref 14.0–72.0)

## 2013-11-21 LAB — WOUND CULTURE

## 2013-11-21 LAB — BASIC METABOLIC PANEL
BUN: 4 mg/dL — AB (ref 6–23)
CHLORIDE: 110 meq/L (ref 96–112)
CO2: 21 meq/L (ref 19–32)
Calcium: 11 mg/dL — ABNORMAL HIGH (ref 8.4–10.5)
Creatinine, Ser: 0.46 mg/dL — ABNORMAL LOW (ref 0.50–1.10)
GFR calc Af Amer: >90 mL/min (ref >90)
GLUCOSE: 133 mg/dL — AB (ref 70–99)
POTASSIUM: 3.1 meq/L — AB (ref 3.7–5.3)
Sodium: 144 mEq/L (ref 137–147)

## 2013-11-22 LAB — BASIC METABOLIC PANEL
BUN: 4 mg/dL — AB (ref 6–23)
CO2: 21 meq/L (ref 19–32)
CREATININE: 0.44 mg/dL — AB (ref 0.50–1.10)
Calcium: 10.9 mg/dL — ABNORMAL HIGH (ref 8.4–10.5)
Chloride: 109 mEq/L (ref 96–112)
GFR calc Af Amer: >90 mL/min (ref >90)
GFR calc non Af Amer: >90 mL/min (ref >90)
GLUCOSE: 134 mg/dL — AB (ref 70–99)
POTASSIUM: 3.1 meq/L — AB (ref 3.7–5.3)
Sodium: 143 mEq/L (ref 137–147)

## 2013-11-23 ENCOUNTER — Other Ambulatory Visit (HOSPITAL_COMMUNITY): Payer: Medicare Other

## 2013-11-23 LAB — BASIC METABOLIC PANEL
BUN: 5 mg/dL — ABNORMAL LOW (ref 6–23)
CALCIUM: 10.7 mg/dL — AB (ref 8.4–10.5)
CHLORIDE: 115 meq/L — AB (ref 96–112)
CO2: 20 meq/L (ref 19–32)
CREATININE: 0.5 mg/dL (ref 0.50–1.10)
GFR calc Af Amer: >90 mL/min (ref >90)
GFR calc non Af Amer: >90 mL/min (ref >90)
GLUCOSE: 87 mg/dL (ref 70–99)
Potassium: 3.2 mEq/L — ABNORMAL LOW (ref 3.7–5.3)
Sodium: 148 mEq/L — ABNORMAL HIGH (ref 137–147)

## 2013-11-24 ENCOUNTER — Other Ambulatory Visit (HOSPITAL_COMMUNITY): Payer: Medicare Other

## 2013-11-24 LAB — BASIC METABOLIC PANEL
BUN: 6 mg/dL (ref 6–23)
CALCIUM: 10.8 mg/dL — AB (ref 8.4–10.5)
CO2: 19 meq/L (ref 19–32)
Chloride: 114 mEq/L — ABNORMAL HIGH (ref 96–112)
Creatinine, Ser: 0.6 mg/dL (ref 0.50–1.10)
GFR calc Af Amer: >90 mL/min (ref >90)
GFR, EST NON AFRICAN AMERICAN: 88 mL/min — AB (ref >90)
Glucose, Bld: 96 mg/dL (ref 70–99)
Potassium: 3.1 mEq/L — ABNORMAL LOW (ref 3.7–5.3)
Sodium: 145 mEq/L (ref 137–147)

## 2013-11-24 MED ORDER — IOHEXOL 300 MG/ML  SOLN
50.0000 mL | Freq: Once | INTRAMUSCULAR | Status: AC | PRN
  Administered 2013-11-24: 35 mL via ORAL

## 2013-11-25 LAB — BASIC METABOLIC PANEL
BUN: 5 mg/dL — ABNORMAL LOW (ref 6–23)
CHLORIDE: 109 meq/L (ref 96–112)
CO2: 20 mEq/L (ref 19–32)
CREATININE: 0.56 mg/dL (ref 0.50–1.10)
Calcium: 10.2 mg/dL (ref 8.4–10.5)
GFR, EST NON AFRICAN AMERICAN: 90 mL/min — AB (ref >90)
Glucose, Bld: 87 mg/dL (ref 70–99)
POTASSIUM: 3.6 meq/L — AB (ref 3.7–5.3)
Sodium: 142 mEq/L (ref 137–147)

## 2013-11-26 LAB — BASIC METABOLIC PANEL
BUN: 5 mg/dL — AB (ref 6–23)
CHLORIDE: 110 meq/L (ref 96–112)
CO2: 18 meq/L — AB (ref 19–32)
Calcium: 9.4 mg/dL (ref 8.4–10.5)
Creatinine, Ser: 0.54 mg/dL (ref 0.50–1.10)
GFR calc Af Amer: >90 mL/min (ref >90)
GFR calc non Af Amer: >90 mL/min (ref >90)
Glucose, Bld: 98 mg/dL (ref 70–99)
Potassium: 3.3 mEq/L — ABNORMAL LOW (ref 3.7–5.3)
Sodium: 141 mEq/L (ref 137–147)

## 2013-11-27 LAB — BASIC METABOLIC PANEL
BUN: 6 mg/dL (ref 6–23)
CALCIUM: 9.1 mg/dL (ref 8.4–10.5)
CO2: 20 meq/L (ref 19–32)
CREATININE: 0.51 mg/dL (ref 0.50–1.10)
Chloride: 108 mEq/L (ref 96–112)
GFR calc Af Amer: >90 mL/min (ref >90)
GLUCOSE: 109 mg/dL — AB (ref 70–99)
Potassium: 3.3 mEq/L — ABNORMAL LOW (ref 3.7–5.3)
SODIUM: 139 meq/L (ref 137–147)

## 2013-11-28 LAB — BASIC METABOLIC PANEL
BUN: 9 mg/dL (ref 6–23)
CALCIUM: 10 mg/dL (ref 8.4–10.5)
CO2: 20 mEq/L (ref 19–32)
CREATININE: 0.57 mg/dL (ref 0.50–1.10)
Chloride: 117 mEq/L — ABNORMAL HIGH (ref 96–112)
GFR, EST NON AFRICAN AMERICAN: 89 mL/min — AB (ref >90)
GLUCOSE: 125 mg/dL — AB (ref 70–99)
Potassium: 3.5 mEq/L — ABNORMAL LOW (ref 3.7–5.3)
Sodium: 150 mEq/L — ABNORMAL HIGH (ref 137–147)

## 2013-11-28 LAB — CLOSTRIDIUM DIFFICILE BY PCR: Toxigenic C. Difficile by PCR: NEGATIVE

## 2013-11-29 ENCOUNTER — Encounter (HOSPITAL_COMMUNITY): Payer: Self-pay | Admitting: *Deleted

## 2013-11-29 ENCOUNTER — Inpatient Hospital Stay (HOSPITAL_COMMUNITY)
Admission: AD | Admit: 2013-11-29 | Discharge: 2013-12-08 | DRG: 374 | Disposition: A | Discharge status: Skilled Nursing Facility | Payer: Medicare Other | Source: Other Acute Inpatient Hospital | Attending: Family Medicine | Admitting: Family Medicine

## 2013-11-29 DIAGNOSIS — R198 Other specified symptoms and signs involving the digestive system and abdomen: Secondary | ICD-10-CM

## 2013-11-29 DIAGNOSIS — R5381 Other malaise: Secondary | ICD-10-CM | POA: Diagnosis present

## 2013-11-29 DIAGNOSIS — I498 Other specified cardiac arrhythmias: Secondary | ICD-10-CM | POA: Diagnosis present

## 2013-11-29 DIAGNOSIS — E876 Hypokalemia: Secondary | ICD-10-CM | POA: Diagnosis present

## 2013-11-29 DIAGNOSIS — Z515 Encounter for palliative care: Secondary | ICD-10-CM

## 2013-11-29 DIAGNOSIS — E870 Hyperosmolality and hypernatremia: Secondary | ICD-10-CM

## 2013-11-29 DIAGNOSIS — I4891 Unspecified atrial fibrillation: Secondary | ICD-10-CM | POA: Diagnosis present

## 2013-11-29 DIAGNOSIS — C159 Malignant neoplasm of esophagus, unspecified: Secondary | ICD-10-CM

## 2013-11-29 DIAGNOSIS — C154 Malignant neoplasm of middle third of esophagus: Principal | ICD-10-CM | POA: Diagnosis present

## 2013-11-29 DIAGNOSIS — L8993 Pressure ulcer of unspecified site, stage 3: Secondary | ICD-10-CM | POA: Diagnosis present

## 2013-11-29 DIAGNOSIS — Z66 Do not resuscitate: Secondary | ICD-10-CM | POA: Diagnosis present

## 2013-11-29 DIAGNOSIS — E43 Unspecified severe protein-calorie malnutrition: Secondary | ICD-10-CM | POA: Diagnosis present

## 2013-11-29 DIAGNOSIS — Z800 Family history of malignant neoplasm of digestive organs: Secondary | ICD-10-CM

## 2013-11-29 DIAGNOSIS — Z8619 Personal history of other infectious and parasitic diseases: Secondary | ICD-10-CM | POA: Diagnosis present

## 2013-11-29 DIAGNOSIS — L89509 Pressure ulcer of unspecified ankle, unspecified stage: Secondary | ICD-10-CM | POA: Diagnosis present

## 2013-11-29 DIAGNOSIS — Z931 Gastrostomy status: Secondary | ICD-10-CM

## 2013-11-29 DIAGNOSIS — R1310 Dysphagia, unspecified: Secondary | ICD-10-CM | POA: Diagnosis present

## 2013-11-29 DIAGNOSIS — L8990 Pressure ulcer of unspecified site, unspecified stage: Secondary | ICD-10-CM | POA: Diagnosis present

## 2013-11-29 DIAGNOSIS — L89503 Pressure ulcer of unspecified ankle, stage 3: Secondary | ICD-10-CM

## 2013-11-29 DIAGNOSIS — I4892 Unspecified atrial flutter: Secondary | ICD-10-CM

## 2013-11-29 LAB — BASIC METABOLIC PANEL
BUN: 7 mg/dL (ref 6–23)
CALCIUM: 10 mg/dL (ref 8.4–10.5)
CO2: 20 mEq/L (ref 19–32)
CREATININE: 0.43 mg/dL — AB (ref 0.50–1.10)
Chloride: 109 mEq/L (ref 96–112)
GLUCOSE: 114 mg/dL — AB (ref 70–99)
Potassium: 3.6 mEq/L — ABNORMAL LOW (ref 3.7–5.3)
Sodium: 142 mEq/L (ref 137–147)

## 2013-11-29 LAB — MRSA PCR SCREENING: MRSA by PCR: NEGATIVE

## 2013-11-29 MED ORDER — ACETAMINOPHEN 325 MG PO TABS: 650.0000 mg | ORAL_TABLET | Freq: Four times a day (QID) | ORAL | Status: DC | PRN

## 2013-11-29 MED ORDER — INSULIN ASPART 100 UNIT/ML ~~LOC~~ SOLN: 0.0000 [IU] | Freq: Three times a day (TID) | SUBCUTANEOUS | Status: DC

## 2013-11-29 MED ORDER — ONDANSETRON HCL 4 MG/2ML IJ SOLN
4.0000 mg | Freq: Four times a day (QID) | INTRAMUSCULAR | Status: DC | PRN
  Administered 2013-12-06 – 2013-12-07 (×2): 4 mg via INTRAVENOUS
  Filled 2013-11-29 (×2): qty 2

## 2013-11-29 MED ORDER — ATENOLOL 12.5 MG HALF TABLET
12.5000 mg | ORAL_TABLET | Freq: Two times a day (BID) | ORAL | Status: DC
  Administered 2013-11-29: 12.5 mg via ORAL
  Filled 2013-11-29 (×3): qty 1

## 2013-11-29 MED ORDER — MAGNESIUM SULFATE 40 MG/ML IJ SOLN: 2.0000 g | Freq: Once | INTRAMUSCULAR | Status: DC

## 2013-11-29 MED ORDER — OXYCODONE HCL 5 MG PO TABS
5.0000 mg | ORAL_TABLET | Freq: Four times a day (QID) | ORAL | Status: DC | PRN
  Administered 2013-11-30: 5 mg via ORAL
  Filled 2013-11-29: qty 1

## 2013-11-29 MED ORDER — MIDODRINE HCL 5 MG PO TABS
5.0000 mg | ORAL_TABLET | Freq: Two times a day (BID) | ORAL | Status: DC
  Administered 2013-11-30: 5 mg via ORAL
  Filled 2013-11-29 (×3): qty 1

## 2013-11-29 MED ORDER — ACETAMINOPHEN 650 MG RE SUPP: 650.0000 mg | Freq: Four times a day (QID) | RECTAL | Status: DC | PRN

## 2013-11-29 MED ORDER — LEVALBUTEROL HCL 0.63 MG/3ML IN NEBU
0.6300 mg | INHALATION_SOLUTION | Freq: Four times a day (QID) | RESPIRATORY_TRACT | Status: DC
  Filled 2013-11-29 (×5): qty 3

## 2013-11-29 MED ORDER — AMIODARONE HCL 200 MG PO TABS
200.0000 mg | ORAL_TABLET | Freq: Two times a day (BID) | ORAL | Status: DC
  Administered 2013-11-29 – 2013-12-01 (×4): 200 mg via ORAL
  Filled 2013-11-29 (×5): qty 1

## 2013-11-29 MED ORDER — HEPARIN SODIUM (PORCINE) 5000 UNIT/ML IJ SOLN
5000.0000 [IU] | Freq: Three times a day (TID) | INTRAMUSCULAR | Status: DC
  Administered 2013-11-29 – 2013-12-08 (×27): 5000 [IU] via SUBCUTANEOUS
  Filled 2013-11-29 (×32): qty 1

## 2013-11-29 MED ORDER — PANTOPRAZOLE SODIUM 40 MG IV SOLR
40.0000 mg | INTRAVENOUS | Status: DC
  Filled 2013-11-29: qty 40

## 2013-11-29 MED ORDER — ONDANSETRON HCL 4 MG PO TABS
4.0000 mg | ORAL_TABLET | Freq: Four times a day (QID) | ORAL | Status: DC | PRN
Start: 2013-11-29 — End: 2013-12-08

## 2013-11-29 MED ORDER — KCL IN DEXTROSE-NACL 10-5-0.45 MEQ/L-%-% IV SOLN
INTRAVENOUS | Status: DC
  Administered 2013-11-29: 19:00:00 via INTRAVENOUS
  Filled 2013-11-29 (×2): qty 1000

## 2013-11-29 NOTE — H&P (Signed)
[facility] History and Physical  Diella Siebert KYH:[id number] DOB: [birth year] DOA: 11/29/2013  Referring physician: Dr. Shawnie Pons  PCP: No PCP Per Patient   Chief Complaint: esophageal cancer, atrial flutter/a. fib  HPI: Heidi Hall is a 73 y.o. female with pmh of esophageal cancer, atrial flutter/atrial fibrillation, sacral decubitus ulcer, hx of perforated viscous and deconditioning; ended on [facility] for about 1 month. Patient is now transferred to [place] for further evaluation and treatment given findings of esophageal mass pressing on her heart and to receive further staging/treatment from oncology service. Patient denies any CP, SOB, fever, cough, hematemesis, melena or any other complaints currently. She has been hypercalcemic and with intermittent episodes of SVT. Currently calcium stable and rate is 69. Please referred to discharge summary from [facility] for further info/details on her history prior and during admission there.  CVTS and oncology consulted.    Review of Systems:  Negative except as otherwise mentioned on HPI.   History reviewed. No pertinent past medical history. Past Surgical History  Procedure Laterality Date  . Diagnostic laparoscopy     Social History:  reports that she has never smoked. She does not have any smokeless tobacco history on file. Her alcohol and drug histories are not on file.  NKDA  Family history: positive for HTN and HLD; mother with stomach cancer at age 33; otherwise no contributory according to patient  Prior to Admission medications   Not on File   Physical Exam: Filed Vitals:   11/29/13 1800  BP: 93/55  Pulse: 62  Temp: 98.4 F (36.9 C)  Resp: 19    BP 93/55  Pulse 62  Temp(Src) 98.4 F (36.9 C) (Oral)  Resp 19  Ht 5\' 2"  (1.575 m)  Wt 73.6 kg (162 lb 4.1 oz)  BMI 29.67 kg/m2  SpO2 93%  General:  Appears calm and comfortable; afebrile Eyes: PERRL, normal lids, irises & conjunctiva ENT: grossly normal hearing,  dry lips, no erythema or exudates; fair dentition Neck: no LAD, masses or thyromegaly, no JVD Cardiovascular: regular rate, no m/r/g. No LE edema. Respiratory: CTA bilaterally, no w/r/r. Normal respiratory effort. Abdomen: soft, nt, nd, positive BS Skin: no rash or induration seen on exam Musculoskeletal: grossly normal tone BUE/BLE Psychiatric: grossly normal mood and affect, speech fluent and appropriate Neurologic: grossly non-focal.          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 -oncology and CVTS consulted -continue supportive care for now and follow rec's  2-Atrial flutter/fibrillation: continue amiodarone. Patient with episodes of NSR. Currently rate controlled. -will check TSH -continue atenolol -appears to be secondary to external irritation from mass -preserved EF on 2-D echo 11/2013  3-Hx of Clostridium difficile infection: treatment completed. No diarrhea currently. -will recheck C. Diff by PCR  4-Hypercalcemia: from malignancy -stable now. -continue IVF's -has received pamidronate and calcitonin -if further elevation will recommend another dose of aredia -monitor on telemetry  5-hypokalemia: will add potassium to  IVF's -give magnesium (especially with ongoing arrhythmias) -BMET and MG in am  6-hx of Perforated viscus: CT w/o further collections. Continue peg tube  7-severe protein calorie malnutrition -nutrition service consulted -continue tube feedings  8-Decubitus ulcer of ankle,  stage 3: wound care consulted -overlay mattress ordered for prevention  DVT: heparin   Cardio thoracic surgery (Dr. Nils Pyle) and oncology (Dr. Marin Olp)  Code Status: Full Family Communication: no family at bedside Disposition Plan: LOS > 2 midnights, inpatient, stepdown  Time spent: 52 minutes  Felder Lebeda [facility] Pager [phone]

## 2013-11-30 ENCOUNTER — Ambulatory Visit
Admit: 2013-11-30 | Discharge: 2013-11-30 | Disposition: A | Discharge status: Home / Self Care | Payer: Medicare Other | Attending: Radiation Oncology | Admitting: Radiation Oncology

## 2013-11-30 ENCOUNTER — Encounter: Payer: Self-pay | Admitting: Radiation Oncology

## 2013-11-30 DIAGNOSIS — C159 Malignant neoplasm of esophagus, unspecified: Secondary | ICD-10-CM

## 2013-11-30 DIAGNOSIS — I5189 Other ill-defined heart diseases: Secondary | ICD-10-CM

## 2013-11-30 DIAGNOSIS — C154 Malignant neoplasm of middle third of esophagus: Principal | ICD-10-CM

## 2013-11-30 DIAGNOSIS — R69 Illness, unspecified: Secondary | ICD-10-CM

## 2013-11-30 DIAGNOSIS — R1319 Other dysphagia: Secondary | ICD-10-CM

## 2013-11-30 LAB — COMPREHENSIVE METABOLIC PANEL
ALT: 14 U/L (ref 0–35)
AST: 14 U/L (ref 0–37)
Albumin: 1.7 g/dL — ABNORMAL LOW (ref 3.5–5.2)
Alkaline Phosphatase: 55 U/L (ref 39–117)
BILIRUBIN TOTAL: 0.2 mg/dL — AB (ref 0.3–1.2)
BUN: 7 mg/dL (ref 6–23)
CALCIUM: 9.4 mg/dL (ref 8.4–10.5)
CHLORIDE: 107 meq/L (ref 96–112)
CO2: 20 meq/L (ref 19–32)
CREATININE: 0.44 mg/dL — AB (ref 0.50–1.10)
GLUCOSE: 323 mg/dL — AB (ref 70–99)
Potassium: 4.9 mEq/L (ref 3.7–5.3)
Sodium: 137 mEq/L (ref 137–147)
Total Protein: 5.7 g/dL — ABNORMAL LOW (ref 6.0–8.3)

## 2013-11-30 LAB — CBC
HCT: 34.7 % — ABNORMAL LOW (ref 36.0–46.0)
Hemoglobin: 10.8 g/dL — ABNORMAL LOW (ref 12.0–15.0)
MCH: 28.7 pg (ref 26.0–34.0)
MCHC: 31.1 g/dL (ref 30.0–36.0)
MCV: 92.3 fL (ref 78.0–100.0)
PLATELETS: 285 10*3/uL (ref 150–400)
RBC: 3.76 MIL/uL — AB (ref 3.87–5.11)
RDW: 15.9 % — ABNORMAL HIGH (ref 11.5–15.5)
WBC: 15.1 10*3/uL — ABNORMAL HIGH (ref 4.0–10.5)

## 2013-11-30 LAB — MAGNESIUM: Magnesium: 2 mg/dL (ref 1.5–2.5)

## 2013-11-30 LAB — URINE MICROSCOPIC-ADD ON

## 2013-11-30 LAB — URINALYSIS, ROUTINE W REFLEX MICROSCOPIC
BILIRUBIN URINE: NEGATIVE
GLUCOSE, UA: NEGATIVE mg/dL
HGB URINE DIPSTICK: NEGATIVE
Ketones, ur: NEGATIVE mg/dL
Nitrite: NEGATIVE
Protein, ur: NEGATIVE mg/dL
Specific Gravity, Urine: 1.013 (ref 1.005–1.030)
Urobilinogen, UA: 0.2 mg/dL (ref 0.0–1.0)
pH: 8.5 — ABNORMAL HIGH (ref 5.0–8.0)

## 2013-11-30 LAB — PREALBUMIN: PREALBUMIN: 6.2 mg/dL — AB (ref 17.0–34.0)

## 2013-11-30 LAB — GLUCOSE, CAPILLARY
GLUCOSE-CAPILLARY: 45 mg/dL — AB (ref 70–99)
GLUCOSE-CAPILLARY: 75 mg/dL (ref 70–99)
Glucose-Capillary: 70 mg/dL (ref 70–99)
Glucose-Capillary: 79 mg/dL (ref 70–99)
Glucose-Capillary: 74 mg/dL (ref 70–99)
Glucose-Capillary: 82 mg/dL (ref 70–99)

## 2013-11-30 LAB — TSH: TSH: 0.774 u[IU]/mL (ref 0.350–4.500)

## 2013-11-30 LAB — HEMOGLOBIN A1C
Hgb A1c MFr Bld: 5.2 % (ref <5.7)
MEAN PLASMA GLUCOSE: 103 mg/dL (ref <117)

## 2013-11-30 MED ORDER — OXYCODONE HCL 5 MG PO TABS
5.0000 mg | ORAL_TABLET | ORAL | Status: DC | PRN
  Administered 2013-11-30 – 2013-12-05 (×5): 5 mg via ORAL
  Filled 2013-11-30 (×5): qty 1

## 2013-11-30 MED ORDER — MIDODRINE HCL 5 MG PO TABS
5.0000 mg | ORAL_TABLET | Freq: Three times a day (TID) | ORAL | Status: DC
  Administered 2013-11-30 – 2013-12-08 (×20): 5 mg via ORAL
  Filled 2013-11-30 (×28): qty 1

## 2013-11-30 MED ORDER — AMIODARONE HCL 200 MG PO TABS
200.0000 mg | ORAL_TABLET | Freq: Two times a day (BID) | ORAL | Status: DC
  Administered 2013-11-30: 200 mg via ORAL
  Filled 2013-11-30 (×4): qty 1

## 2013-11-30 MED ORDER — NUTRITIONAL SUPPLEMENT PO LIQD: 10.0000 mL/h | Freq: Every day | ORAL | Status: DC

## 2013-11-30 MED ORDER — ADULT MULTIVITAMIN W/MINERALS CH
1.0000 | ORAL_TABLET | Freq: Every day | ORAL | Status: DC
  Administered 2013-11-30 – 2013-12-08 (×9): 1 via ORAL
  Filled 2013-11-30 (×10): qty 1

## 2013-11-30 MED ORDER — MAGNESIUM OXIDE 400 MG PO TABS
400.0000 mg | ORAL_TABLET | Freq: Every day | ORAL | Status: DC
  Administered 2013-11-30 – 2013-12-08 (×9): 400 mg via ORAL
  Filled 2013-11-30 (×9): qty 1

## 2013-11-30 MED ORDER — METOPROLOL TARTRATE 1 MG/ML IV SOLN: 2.5000 mg | INTRAVENOUS | Status: DC | PRN

## 2013-11-30 MED ORDER — LEVALBUTEROL HCL 0.63 MG/3ML IN NEBU: 0.6300 mg | INHALATION_SOLUTION | Freq: Four times a day (QID) | RESPIRATORY_TRACT | Status: DC | PRN

## 2013-11-30 MED ORDER — ENOXAPARIN SODIUM 40 MG/0.4ML ~~LOC~~ SOLN: 40.0000 mg | SUBCUTANEOUS | Status: DC

## 2013-11-30 MED ORDER — THERA M PLUS PO TABS: 1.0000 | ORAL_TABLET | Freq: Every day | ORAL | Status: DC

## 2013-11-30 MED ORDER — SODIUM CHLORIDE 0.9 % IV SOLN
INTRAVENOUS | Status: DC
  Administered 2013-11-30 – 2013-12-01 (×3): via INTRAVENOUS

## 2013-11-30 MED ORDER — OXYCODONE HCL 5 MG PO CAPS: 5.0000 mg | ORAL_CAPSULE | ORAL | Status: DC | PRN

## 2013-11-30 MED ORDER — OSMOLITE 1.2 CAL PO LIQD
1000.0000 mL | ORAL | Status: DC
  Administered 2013-11-30 – 2013-12-07 (×5): 1000 mL
  Filled 2013-11-30 (×17): qty 1000

## 2013-11-30 MED ORDER — DEXTROSE 50 % IV SOLN
25.0000 mL | Freq: Once | INTRAVENOUS | Status: AC
  Administered 2013-11-30: 17:00:00 via INTRAVENOUS

## 2013-11-30 MED ORDER — DEXTROSE 50 % IV SOLN
INTRAVENOUS | Status: AC
  Filled 2013-11-30: qty 50

## 2013-11-30 MED ORDER — ACETAMINOPHEN 325 MG PO TABS
650.0000 mg | ORAL_TABLET | Freq: Four times a day (QID) | ORAL | Status: DC | PRN
  Administered 2013-12-05: 650 mg via ORAL

## 2013-11-30 MED ORDER — RISAQUAD PO CAPS
1.0000 | ORAL_CAPSULE | Freq: Two times a day (BID) | ORAL | Status: DC
  Filled 2013-11-30 (×3): qty 1

## 2013-11-30 MED ORDER — FOLIC ACID 1 MG PO TABS
1.0000 mg | ORAL_TABLET | Freq: Every day | ORAL | Status: DC
  Administered 2013-11-30 – 2013-12-08 (×9): 1 mg via ORAL
  Filled 2013-11-30 (×10): qty 1

## 2013-11-30 MED ORDER — FAMOTIDINE IN NACL 20-0.9 MG/50ML-% IV SOLN
20.0000 mg | Freq: Two times a day (BID) | INTRAVENOUS | Status: DC
  Administered 2013-11-30 – 2013-12-02 (×5): 20 mg via INTRAVENOUS
  Filled 2013-11-30 (×9): qty 50

## 2013-11-30 MED ORDER — PRO-STAT SUGAR FREE PO LIQD
30.0000 mL | Freq: Every day | ORAL | Status: DC
  Administered 2013-11-30 – 2013-12-08 (×9): 30 mL via ORAL
  Filled 2013-11-30 (×9): qty 30

## 2013-11-30 MED ORDER — ONDANSETRON HCL 4 MG PO TABS: 4.0000 mg | ORAL_TABLET | Freq: Three times a day (TID) | ORAL | Status: DC | PRN

## 2013-11-30 MED ORDER — IPRATROPIUM-ALBUTEROL 0.5-2.5 (3) MG/3ML IN SOLN: 3.0000 mL | RESPIRATORY_TRACT | Status: DC | PRN

## 2013-11-30 MED ORDER — SENNA-DOCUSATE SODIUM 8.6-50 MG PO TABS
1.0000 | ORAL_TABLET | Freq: Two times a day (BID) | ORAL | Status: DC | PRN
  Filled 2013-11-30: qty 1

## 2013-11-30 MED ORDER — OXYCODONE HCL 5 MG/5ML PO SOLN
5.0000 mg | ORAL | Status: DC | PRN
  Administered 2013-12-01 – 2013-12-06 (×3): 5 mg
  Filled 2013-11-30 (×3): qty 5

## 2013-11-30 NOTE — Progress Notes (Addendum)
[facility] Progress Note [place] TEAM 1 - Stepdown/ICU TEAM   Heidi Hall [id number] DOB: [birth year] DOA: 11/29/2013 PCP: No PCP Per Patient  Brief narrative: Heidi Hall is a 73 y.o. female presenting on 11/29/2013 with PMH of esophageal cancer diagnosed in 12/14, atrial flutter/atrial fibrillation, sacral decubitus ulcer, hx of perforated viscous after a PEG placement, C diff last month and deconditioning. The patient was transferred from [facility] to [place] where she has been for about 1 month. 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 A PEG tube was placed which resulted in perforation, acute peritonitis and vent dependent respiratory failure. She was found to have sputum positive for E. Coli and also developed C diff colitis which has been completely treated. She was referred to [facility] and was planned to discharge from there on 12/11. However she was found to go into SVT and an ECHO was obtained which revealed a mass compressing the left atrium. She was sent to [facility] for further work up. Oncology and CT surgery was consulted.   Subjective: Pt has no complaint other than back pain for the past few hrs. She believes it's from the bed.   Assessment/Plan: Principal Problem:   Esophageal cancer- 10 cm - not a surgical candidate for resection or stenting per CT surgery and palliative care has been suggested by Dr Nils Pyle - Dr Marin Olp discussing palliative chemo/radiation with the patient once he is able to obtain the path report. She would like him to further discuss it with herself in the presence of her daughter (lives 1 hr away but is at bedside now).  - start tube feeds- it appears that she was being allowed clears at [facility] at one point- will need to readdress with repeat SLP eval  Active Problems:   Atrial flutter - currently sinus brady - cont Amiodarone - avoid full  anticoagulation due to cancerous mass in esophagus  Hypotension - obtain AM cortisol - hydrate  - holding B Blocker    Hx of Clostridium difficile infection - treated and no current GI symptoms - d/c Protonix and start Pepcid  Leukocytosis - follow- no fever- obtain diff in AM-  Pressure ulcer Stage 3- sacral - WOC eval    Hypercalcemia - asymptomatic - follow- corrected is 11.7 today   Code Status: Full code Family Communication: with daughter at bedside Disposition Plan: follow in SDU today  Consultants: Onc CT surgery  Procedures: none  Antibiotics: none  DVT prophylaxis: Heparin  Objective: Filed Weights   11/29/13 1800 11/30/13 0500  Weight: 73.6 kg (162 lb 4.1 oz) 74 kg (163 lb 2.3 oz)   Blood pressure 85/49, pulse 58, temperature 98.1 F (36.7 C), temperature source Oral, resp. rate 18, height 5\' 2"  (1.575 m), weight 74 kg (163 lb 2.3 oz), SpO2 98.00%.  Intake/Output Summary (Last 24 hours) at 11/30/13 1532 Last data filed at 11/30/13 0700  Gross per 24 hour  Intake      0 ml  Output    401 ml  Net   -401 ml     Exam: General: No acute respiratory distress- AAO x 3 Lungs: Clear to auscultation bilaterally without wheezes or crackles Cardiovascular: Regular rate and rhythm without murmur gallop or rub normal S1 and S2 Abdomen: Nontender, nondistended, soft, bowel sounds positive, no rebound, no ascites, no appreciable mass- PEG intact- dried blood and discharge on dressing around PEG. No pus noted,.  Extremities: No  significant cyanosis, clubbing, or edema bilateral lower extremities  Data Reviewed: Basic Metabolic Panel:  Recent Labs Lab 11/26/13 0630 11/27/13 0500 11/28/13 0618 11/29/13 1005 11/30/13 0500  NA 141 139 150* 142 137  K 3.3* 3.3* 3.5* 3.6* 4.9  CL 110 108 117* 109 107  CO2 18* 20 20 20 20   GLUCOSE 98 109* 125* 114* 323*  BUN 5* 6 9 7 7   CREATININE 0.54 0.51 0.57 0.43* 0.44*  CALCIUM 9.4 9.1 10.0 10.0 9.4  MG  --    --   --   --  2.0   Liver Function Tests:  Recent Labs Lab 11/30/13 0500  AST 14  ALT 14  ALKPHOS 55  BILITOT 0.2*  PROT 5.7*  ALBUMIN 1.7*   No results found for this basename: LIPASE, AMYLASE,  in the last 168 hours No results found for this basename: AMMONIA,  in the last 168 hours CBC:  Recent Labs Lab 11/30/13 0500  WBC 15.1*  HGB 10.8*  HCT 34.7*  MCV 92.3  PLT 285   Cardiac Enzymes: No results found for this basename: CKTOTAL, CKMB, CKMBINDEX, TROPONINI,  in the last 168 hours BNP (last 3 results) No results found for this basename: PROBNP,  in the last 8760 hours CBG:  Recent Labs Lab 11/30/13 0552 11/30/13 0720 11/30/13 1116  GLUCAP 70 79 74    Recent Results (from the past 240 hour(s))  CLOSTRIDIUM DIFFICILE BY PCR     Status: None   Collection Time    11/28/13 11:43 AM      Result Value Ref Range Status   C difficile by pcr NEGATIVE  NEGATIVE Final  MRSA PCR SCREENING     Status: None   Collection Time    11/29/13  5:36 PM      Result Value Ref Range Status   MRSA by PCR NEGATIVE  NEGATIVE Final   Comment:            The GeneXpert MRSA Assay (FDA     approved for NASAL specimens     only), is one component of a     comprehensive MRSA colonization     surveillance program. It is not     intended to diagnose MRSA     infection nor to guide or     monitor treatment for     MRSA infections.     Studies:  Recent x-ray studies have been reviewed in detail by the Attending Physician  Scheduled Meds:  Scheduled Meds: . acidophilus  1 capsule Oral BID  . amiodarone  200 mg Oral BID  . amiodarone  200 mg Oral BID  . feeding supplement (PRO-STAT SUGAR FREE 64)  30 mL Oral Daily  . folic acid  1 mg Oral Daily  . heparin  5,000 Units Subcutaneous 3 times per day  . insulin aspart  0-9 Units Subcutaneous TID WC  . magnesium oxide  400 mg Oral Daily  . magnesium sulfate 1 - 4 g bolus IVPB  2 g Intravenous Once  . midodrine  5 mg Oral TID WC   . multivitamin with minerals  1 tablet Oral Daily  . pantoprazole (PROTONIX) IV  40 mg Intravenous Q24H   Continuous Infusions: . sodium chloride 100 mL/hr at 11/30/13 1100    Time spent on care of this patient: >35 min   Debbe Odea, MD  [facility] Office  [phone] Pager - Text Page per Shea Evans as per below:  On-Call/Text Page:  [email or web address]      password [email or web address]  If 7PM-7AM, please contact night-coverage [email or web address] Password [place] 11/30/2013, 3:32 PM   LOS: 1 day

## 2013-11-30 NOTE — Plan of Care (Signed)
Chart reviewed. Noted to be hyperglycemic with low to soft BP readings. Lopressor dc'd and IVF changed from dextrose containing to NS at 100/hr. Also adjusted pain med to liquid form to give per tube since has significant issues with painful swallowing 2/2 esophageal mass.  Erin Hearing, ANP

## 2013-11-30 NOTE — Consult Note (Signed)
[place]  [phone]   [place]  DOB: 29-Dec-1940  MR#: [id number]  CSN#: [id number]    Requesting Physician: [facility]  Teaching Service   Dr.   Darl Householder MD:   reason for consult: Esophageal cancer   History of present illness:       73 y.o. AA  female   [place]  Poor[place] squamous [place] carcinoma of the  mid-esophagus with compression of the left atrium, transferred to [facility] from [facility]  for  evaluation and treatment of extrinsic mass , for which dr. Marin Olp has been consulted. In review, she presented to [facility]  in December of 2014 with generalized weakness and fatigue, along with gait instability and weight loss which prompted furhter workup. She was found to have hypercalcemia requiring Zometa and Calcitonin.She was also complaining of dysphagia, requiring EGD. Biopsies the lesions were positive for Squamous Cell Carcinoma. Status was complicated by severe malnutrition needing a PEG tube placement with subsequent perforation and acute peritonitis, intubation and mechanical ventilation.Sputum was positive for EColi, C diff colitis, needing IV meds Thus, she was transferred [facility] with eventual discharge on 11/29/13.She was admitted to the ICU due to left atrial compression due to the esophageal mass, which compromises rate and rhythm (Afib). CT of the chest showed a  10.5 x 6.4 cm  mass within the distal esophagus or adjacent to the distal esophagus    with  extrinsic mass effect on the left atrium.No distant metastases identified.  Persistent bilateral pleural effusions with scattered atelectasis in both lungs and a  small right upper lobe perifissural nodule were seen. . Asymmetric enlargement of the left thyroid lobe with calcifications and a low-density nodule were noted.   Past medical history:   Patient had never been hospitalized  before.  Past surgical history:      Past Surgical History  Procedure Laterality Date  . Diagnostic laparoscopy      Medications:  Scheduled Meds: . amiodarone  200 mg Oral BID  . heparin  5,000 Units Subcutaneous 3 times per day  . insulin aspart  0-9 Units Subcutaneous TID WC  . magnesium sulfate 1 - 4 g bolus IVPB  2 g Intravenous Once  . midodrine  5 mg Oral BID WC  . pantoprazole (PROTONIX) IV  40 mg Intravenous Q24H   Continuous Infusions: . sodium chloride     PRN Meds:.acetaminophen, acetaminophen, levalbuterol, ondansetron (ZOFRAN) IV, ondansetron, oxyCODONE  Allergies: No Known Allergies  Family history:    Mother died at 1 with stomach cancer. Father was killed.                                         Social history:  Divorced. 4 children. Daughter Helene Kelp is POA. Lives in [place], [place].  Never smoked. Drank moderately, not for many years.  Review of systems:  See HPI for significant positives. Increased salivation. Rest of ROS negative.No other oncological history   Physical exam:      Filed Vitals:   11/30/13 0717  BP: 79/42  Pulse: 57  Temp: 98.6 F (37 C)  Resp: 24     Body mass index is 29.83 kg/(m^2).   General: 73 y.o. female  in no acute distress A. and O. x3  well-developed and well-nourished.  HEENT: temporal wasting. PERRLA.  Oral cavity without thrush or lesions. Neck supple. no thyromegaly, no cervical or supraclavicular adenopathy  Lungs clear bilaterally . No wheezing, rhonchi or rales. No axillary masses. Breasts: not examined. Cardiac regular rate and rhythm normal S1-S2, no murmur , rubs or gallops Abdomen soft nontender , bowel sounds x4. No HSM. No masses palpable. G tube in place.  GU/rectal: deferred. Extremities no clubbing, no  cyanosis , trace edema. Musculoskeletal: no spinal tenderness.  Skin: Wound care managing Stage III Pressure ulcer in sacrum Neuro: Non Focal   Lab results:       CBC  Recent Labs Lab  11/30/13 0500  WBC 15.1*  HGB 10.8*  HCT 34.7*  PLT 285  MCV 92.3  MCH 28.7  MCHC 31.1  RDW 15.9*    Anemia panel:  No results found for this basename: VITAMINB12, FOLATE, FERRITIN, TIBC, IRON, RETICCTPCT,  in the last 72 hours   Chemistries   Recent Labs Lab 11/26/13 0630 11/27/13 0500 11/28/13 0618 11/29/13 1005 11/30/13 0500  NA 141 139 150* 142 137  K 3.3* 3.3* 3.5* 3.6* 4.9  CL 110 108 117* 109 107  CO2 18* 20 20 20 20   GLUCOSE 98 109* 125* 114* 323*  BUN 5* 6 9 7 7   CREATININE 0.54 0.51 0.57 0.43* 0.44*  CALCIUM 9.4 9.1 10.0 10.0 9.4  MG  --   --   --   --  2.0      Studies:      Ct Chest Wo Contrast  11/20/2013   CLINICAL DATA:  Staging for esophageal mass.  EXAM: CT CHEST WITHOUT CONTRAST  TECHNIQUE: Multidetector CT imaging of the chest was performed following the standard protocol without IV contrast.  COMPARISON:  DG ESOPHAGUS dated 11/16/2013; DG CHEST 1V PORT dated 11/15/2013; CT ABD/PELVIS W CM dated 11/03/2013; CT ABD/PELVIS W CM dated 10/29/2013  FINDINGS: There is some contrast material within the proximal esophageal lumen which is mildly distended by the large distal esophageal mass. The mass itself is not well delineated by this noncontrast examination. The esophagus measures up to 10.5 x 6.4 cm transverse on image 33. There is mass effect on the left atrium, although no clear cardiac involvement. There is mild splaying of the carina. The superior mediastinal fat planes are maintained. No discrete enlarged mediastinal or hilar lymph nodes are identified.  Right arm PICC extends to the mid SVC. There is mild diffuse atherosclerosis of the aorta, great vessels and coronary arteries. There are multiple calcifications throughout the left thyroid lobe associated with a central low density 1.4 cm lesion on image 6. The left lobe is diffusely enlarged.  There are moderate right and small left pleural effusions. The left pleural effusion extends into the superior aspect  of the major fissure. No significant pericardial fluid is present.  There is scattered atelectasis in both lungs, primarily adjacent to the pleural effusions. A 3 mm perifissural nodule is noted in the right upper lobe on image 24. No other pulmonary nodules are identified.  The visualized upper abdomen has a stable appearance. Patient has a G-tube. There are no worrisome osseous findings.  IMPRESSION: 1. The large distal esophageal mass is not well characterized by this noncontrast study, although demonstrates no definite extra esophageal extension based on this or the prior abdominal CTs. There is extrinsic mass effect on the left atrium. 2. No distant metastases identified. 3. Persistent bilateral pleural effusions with scattered atelectasis in both lungs. A small right upper lobe perifissural nodule is nonspecific.  4. Mild diffuse atherosclerosis. 5. Asymmetric enlargement of the left thyroid lobe with calcifications and a low-density nodule. Consider further evaluation with thyroid ultrasound.   Electronically Signed   By: Camie Patience M.D.   On: 11/20/2013 07:52   Ct Abdomen Pelvis W Contrast  11/03/2013   CLINICAL DATA:  Increased white blood cell count. Esophageal mass on prior CT  EXAM: CT ABDOMEN AND PELVIS WITH CONTRAST  TECHNIQUE: Multidetector CT imaging of the abdomen and pelvis was performed using the standard protocol following bolus administration of intravenous contrast.  CONTRAST:  60mL OMNIPAQUE IOHEXOL 300 MG/ML  SOLN  COMPARISON:  CT ABD/PELVIS W CM dated 10/29/2013  FINDINGS: There percutaneous gastrostomy tube in the stomach. Again demonstrated a large mass within the distal esophagus or adjacent to the distal esophagus measuring 6.0 by 7.3 cm. The stomach contains oral contrast. The small bowel and colon appear are normal without evidence obstruction.  Small bilateral effusions are improved slightly from prior. There is mild ground-glass opacity in the right lung base site of prior  atelectasis. No clear evidence pneumonia.  No focal hepatic lesion. The gallbladder, pancreas, spleen, adrenal glands, and kidneys are normal. There is mild hydroureter on the left bladder is distended. Mild hydroureter on the right additionally. Abdominal aorta is normal caliber. No retroperitoneal periportal lymphadenopathy.  No free fluid the pelvis. The bladder is distended. The uterus and ovaries are normal. No pelvic lymphadenopathy. No aggressive osseous lesion.  IMPRESSION: 1. Large distal esophageal mass 2. Percutaneous gastrostomy tube in place. No evidence of bowel obstruction. 3. Bilateral hydroureter and bladder distension suggest bladder outlet obstruction or neurogenic bladder. Patient may benefit from Foley catheter. 4. Small bilateral effusions are improved.   Electronically Signed   By: Suzy Bouchard M.D.   On: 11/03/2013 20:40    Dg Chest Port 1 View  11/01/2013   ADDENDUM REPORT: 11/01/2013 08:17  ADDENDUM: There is a retrocardiac density that corresponds with the esophageal lesion from the abdominal CT on 10/29/2013. Consider further evaluation with a dedicated chest CT to completely evaluate the esophagus and the etiology for the tracheal deviation.   Electronically Signed   By: Markus Daft M.D.   On: 11/01/2013 08:17   11/01/2013   CLINICAL DATA:  73 year old with coughing.  EXAM: PORTABLE CHEST - 1 VIEW  COMPARISON:  10/25/2013  FINDINGS: Portable view of the chest demonstrates improving aeration in the left lung. There are still patchy densities in the right lower lung. The PICC line has been removed. Heart size is within normal limits and stable. There is persistent tracheal deviation towards the right.  IMPRESSION: Improving aeration in the left lung may represent decreasing airspace disease or edema.  Removal of PICC line.  Tracheal deviation towards the right. Findings could be related to a thyroid goiter but nonspecific. This could be further evaluated with CT.  Electronically  Signed: By: Markus Daft M.D. On: 11/01/2013 07:28    Assessmnent/Plan:72 y.o. 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 According to the notes, she is not candidate for esophageal stenting.  She's not a candidate for esophageal stenting. Dr. Niel Hummer has seen the patient today, reviewing the notes, although to this point, there is no formal path report to review. Her performance status is  -ECOG 2-3., thus, aggressive therapy not recomneded. Dr. Marin Olp states that radiation therapy with low-dose chemotherapy would be  reasonable to try. Prealbumin pending.Patient is evaluating her options, and will let us know of her decision.     Thank you for the referral.   Rondel Jumbo, PA-C 11/30/2013  ADDENDUM:  Please see my previous note. I spoke with radiation oncology.  We will continue to provide support as necessary.

## 2013-11-30 NOTE — Consult Note (Signed)
I will leave a full consult note later.  I spoke to Heidi Hall. She is very nice. We had a good prayer session.  I told her what was going on. By the CT scan, she has locally advanced esophageal cancer. The medical record says squamous cell carcinoma. I do not see any actual pathology report. It would be nice to see the pathology report.  She has a PEG tube in. I don't think this is being used. I would think that she could have feedings through the tube. It doesn't seem like she can swallow much because of this mass.  Her performance status is not that good-ECOG 2-3. I don't think that aggressive therapy could be done but I do think that palliative therapy could be done to try to help shrink this malignancy. I think that radiation therapy with low-dose chemotherapy would be reasonable to try. I don't see that she is or will be a surgical candidate.  I will send off a prealbumin level on her. This can give [place] a good idea as to her reserves.  She's not sure whether she wants to have any treatment. I can understand this would put she's been through. Again, her performance status is not that great right now. Maybe she had feedings, this would improve the performance status.  She wants to think about any therapy. Again, I told her that it would be reasonable to try radiation with low dose chemotherapy. I think with squamous cell carcinoma, we would have a good chance of response although, again, I don't see her been cured as we really could not go with aggressive chemoirradiation therapy.  She will talk to her daughter about all this.  She is very nice. I will certainly pray for her. If she does not want any therapy, then I would get palliative care involved in hospice point she is an outpatient.  Pete E.  Hebrews 12:12

## 2013-11-30 NOTE — Progress Notes (Signed)
INITIAL NUTRITION ASSESSMENT  DOCUMENTATION CODES Per approved criteria  -Severe malnutrition in the context of chronic illness   INTERVENTION: 1.  Enteral nutrition; initiate Osmolite 1.2 @ 35 mL/hr continuous.  Advance by 10 mL q 4 hrs to 70 mL/hr goal with Prostat once daily to provide 2116 kcal, 108g protein, 1377 mL free water.  NUTRITION DIAGNOSIS: Inadequate oral intake related to inability to eat as evidenced by esophageal cancer.   Monitor:  1.  Enteral nutrition; initiation with tolerance.  Pt to meet >/=90% estimated needs with nutrition support.  2.  Wt/wt change; monitor trends  Reason for Assessment: consult; enteral initiation and management  73 y.o. female  Admitting Dx: a fib  ASSESSMENT: Pt with h/o esophageal cancer admitted with a fib.  Pt with h/o sacral decubitus ulcer, perforated viscous and deconditioning.  She is s/p G-tube placement and stent at [facility] regimen (as recently as 2/6): TwoCal HN @ 35 mL/hr with Prostat 30 mL daily and 95 mL water flushes q 2 hrs providing 1780 kcal, 85g protein, and 1728 mL free water.  Pt with ongoing wt loss: 182 lbs (1/5), 178 lbs (1/12), 173 lbs (1/20), 165 lbs (2/6), and 163 lbs on admission. RD notes pt currently undergoing radiation therapy.  Pt with variable intake over the past 1-2 months, overall insufficient to maintain wt.  RD met with pt and family.  Pt reports she weighed 210 lbs prior to cancer dx >1 yr ago.  Family reports that pt was able to consume liquids until recently, she has been NPO and TF held for the past 1 week with G-J tube replacement.  Discussed TF with pt.  She has always tolerated home TF without issues.  Also tolerates flushes q 2 hrs without issues. Would likely tolerate increased TF infusion rate with decreased caloric density.   Nutrition Focused Physical Exam: Subcutaneous Fat:  Orbital Region: mild wasting Upper Arm Region: mild wasting Thoracic and Lumbar Region: mild  wasting  Muscle:  Temple Region: mild wasting Clavicle Bone Region: mild wasting Clavicle and Acromion Bone Region: mild wasting Scapular Bone Region: mild-moderate wasting Dorsal Hand: moderate wasting Patellar Region: WNL Anterior Thigh Region: WNL Posterior Calf Region: mild wasting  Edema: none present  Pt meets criteria for severe MALNUTRITION in the context of chronic illness as evidenced by 10% wt loss in <3 months and mild-moderate subcutaneous fat and muscle wasting.  Height: Ht Readings from Last 1 Encounters:  11/29/13 $RemoveB'5\' 2"'rFxruSQw$  (1.575 m)    Weight: Wt Readings from Last 1 Encounters:  11/30/13 163 lb 2.3 oz (74 kg)    Ideal Body Weight: 110 lbs  % Ideal Body Weight: 148%  Wt Readings from Last 10 Encounters:  11/30/13 163 lb 2.3 oz (74 kg)    Usual Body Weight: 210 lbs prior to cancer dx  % Usual Body Weight: 77%  BMI:  Body mass index is 29.83 kg/(m^2).  Estimated Nutritional Needs: Kcal: 2050-2200 Protein: 95-115g Fluid: >2.0 L/day  Skin: Stage 3 sacral ulcer  Diet Order: NPO  EDUCATION NEEDS: -Education not appropriate at this time   Intake/Output Summary (Last 24 hours) at 11/30/13 1504 Last data filed at 11/30/13 0700  Gross per 24 hour  Intake      0 ml  Output    401 ml  Net   -401 ml    Last BM: PTA  Labs:   Recent Labs Lab 11/28/13 0618 11/29/13 1005 11/30/13 0500  NA 150* 142 137  K 3.5* 3.6* 4.9  CL 117* 109 107  CO2 $Re'20 20 20  'fWF$ BUN $R'9 7 7  'WM$ CREATININE 0.57 0.43* 0.44*  CALCIUM 10.0 10.0 9.4  MG  --   --  2.0  GLUCOSE 125* 114* 323*    CBG (last 3)   Recent Labs  11/30/13 0552 11/30/13 0720 11/30/13 1116  GLUCAP 70 79 74    Scheduled Meds: . acidophilus  1 capsule Oral BID  . amiodarone  200 mg Oral BID  . amiodarone  200 mg Oral BID  . feeding supplement (PRO-STAT SUGAR FREE 64)  30 mL Oral Daily  . folic acid  1 mg Oral Daily  . heparin  5,000 Units Subcutaneous 3 times per day  . insulin aspart  0-9  Units Subcutaneous TID WC  . magnesium oxide  400 mg Oral Daily  . magnesium sulfate 1 - 4 g bolus IVPB  2 g Intravenous Once  . midodrine  5 mg Oral TID WC  . multivitamin with minerals  1 tablet Oral Daily  . pantoprazole (PROTONIX) IV  40 mg Intravenous Q24H    Continuous Infusions: . sodium chloride 100 mL/hr at 11/30/13 1100    History reviewed. No pertinent past medical history.  Past Surgical History  Procedure Laterality Date  . Diagnostic laparoscopy      Brynda Greathouse, MS RD LDN Clinical Inpatient Dietitian Pager: [phone] Weekend/After hours pager: [phone]

## 2013-11-30 NOTE — Progress Notes (Signed)
  Subjective Patient examined, CT chest and 2-D echocardiogram reviewed. Frail 74 year old Afro-American female diagnosed with squamous cell carcinoma of the midesophagus earlier this winter and margins of [facility]. Following placement of a gastrostomy tube she apparently developed a leak with a abdominal sepsis and a critical illness. She was transferred to [facility] for recovery. Her sepsis resolved and she developed significant atrial arrhythmias. Echocardiogram shows compression of a 10 cm esophageal mass against left atrium-no significant pericardial effusion. There is significant restriction of left atrial filling. She was transferred to the hospital, CCU and thoracic surgical evaluation was requested.  The patient cannot clear her own saliva. She denies pain. She's not been out of bed and is bedbound. The patient by CT scan appears to have advanced carcinoma esophagus and is not a surgical candidate for resection. Recommend palliative care.  Vital signs in last 24 hours: Temp:  [97.6 F (36.4 C)-98.6 F (37 C)] 98.6 F (37 C) (02/12 0717) Pulse Rate:  [56-65] 57 (02/12 0717) Cardiac Rhythm:  [-] Normal sinus rhythm (02/11 1800) Resp:  [18-26] 24 (02/12 0717) BP: (79-113)/(42-55) 79/42 mmHg (02/12 0717) SpO2:  [93 %-100 %] 99 % (02/12 0717) Weight:  [162 lb 4.1 oz (73.6 kg)-163 lb 2.3 oz (74 kg)] 163 lb 2.3 oz (74 kg) (02/12 0500)  Hemodynamic parameters for last 24 hours:    Intake/Output from previous day: 02/11 0701 - 02/12 0700 In: -  Out: 401 [Urine:400; Stool:1] Intake/Output this shift:    Exam   Patient currently resting comfortably in a sinus rhythm in the CCU HEENT-wasted facies no palpable neck masses, no adenopathy Lungs clear Cardiac- no murmur or gallop heart rate regular currently Abdomen- scaphoid with gastrostomy tube in place nontender  extremities nontender with minimal edema  Neuro generally weak  Lab Results:  Recent Labs   11/30/13 0500  WBC 15.1*  HGB 10.8*  HCT 34.7*  PLT 285   BMET:  Recent Labs  11/29/13 1005 11/30/13 0500  NA 142 137  K 3.6* 4.9  CL 109 107  CO2 20 20  GLUCOSE 114* 323*  BUN 7 7  CREATININE 0.43* 0.44*  CALCIUM 10.0 9.4    PT/INR: No results found for this basename: LABPROT, INR,  in the last 72 hours ABG No results found for this basename: phart, pco2, po2, hco3, tco2, acidbasedef, o2sat   CBG (last 3)   Recent Labs  11/30/13 0552 11/30/13 0720  GLUCAP 70 79    Assessment/Plan: 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 She's not a candidate for esophageal stenting. Recommend palliative care  LOS: 1 day    VAN TRIGT III,PETER 11/30/2013

## 2013-11-30 NOTE — Progress Notes (Signed)
Md notified of low glucose pt asytomatic

## 2013-11-30 NOTE — Progress Notes (Signed)
RT Note: Pt refusing breathing treatments, denies SOB, BBS clear/diminished. RT changed to prn.

## 2013-11-30 NOTE — Progress Notes (Signed)
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 female who is seen out courtesy of Dr. Burney Gauze for an opinion concerning radiation therapy as part of management of patient's advanced esophageal cancer.  The patient was recently transferred from [facility] where she has been residing approximately a month. Patient was found to have advanced squamous cell carcinoma of the esophagus. She was found to have hypercalcemia had PEG tube placed which resulted in perforation, acute peritonitis and vent dependent respiratory failure. Patient was scheduled to be discharged however patient went into SVT and echo was obtained which revealed a mass compressing left atrium. The patient was transferred to [facility] For further evaluation. Cardiothoracic surgery evaluated the patient and did not feel she was a candidate for surgery.  Medical oncology has been consulted as well as radiation oncology for consideration for treatment.   PREVIOUS RADIATION THERAPY: No  PAST MEDICAL HISTORY:  has no past medical history on file.    PAST SURGICAL HISTORY: Past Surgical History  Procedure Laterality Date  . Diagnostic laparoscopy      FAMILY HISTORY: family history is not on file.  SOCIAL HISTORY:  reports that she has never smoked. She does not have any smokeless tobacco history on file.  ALLERGIES: Review of patient's allergies indicates no known allergies.  MEDICATIONS:  No current facility-administered medications for this encounter.   No current outpatient prescriptions on file.   Facility-Administered Medications Ordered in Other Encounters    Medication Dose Route Frequency Provider Last Rate Last Dose  . 0.9 %  sodium chloride infusion   Intravenous Continuous Samella Parr, NP 100 mL/hr at 11/30/13 1100    . acetaminophen (TYLENOL) tablet 650 mg  650 mg Oral Q6H PRN Barton Dubois, MD       Or  . acetaminophen (TYLENOL) suppository 650 mg  650 mg Rectal Q6H PRN Barton Dubois, MD      . acetaminophen (TYLENOL) tablet 650 mg  650 mg Oral Q6H PRN Debbe Odea, MD      . amiodarone (PACERONE) tablet 200 mg  200 mg Oral BID Barton Dubois, MD   200 mg at 11/30/13 1018  . amiodarone (PACERONE) tablet 200 mg  200 mg Oral BID Debbe Odea, MD   200 mg at 11/30/13 1426  . famotidine (PEPCID) IVPB 20 mg  20 mg Intravenous Q12H Debbe Odea, MD   20 mg at 11/30/13 1700  . feeding supplement (OSMOLITE 1.2 CAL) liquid 1,000 mL  1,000 mL Per Tube Continuous Elonda Husky, RD      . feeding supplement (PRO-STAT SUGAR FREE 64) liquid 30 mL  30 mL Oral Daily Debbe Odea, MD   30 mL at 11/30/13 1428  . folic acid (FOLVITE) tablet 1 mg  1 mg Oral Daily Debbe Odea, MD   1 mg at 11/30/13 1426  . heparin injection 5,000 Units  5,000 Units Subcutaneous 3 times per day Barton Dubois, MD   5,000 Units at 11/30/13 1428  . ipratropium-albuterol (DUONEB) 0.5-2.5 (3) MG/3ML nebulizer solution 3 mL  3 mL Nebulization Q4H PRN Debbe Odea, MD      . levalbuterol (XOPENEX) nebulizer solution 0.63  mg  0.63 mg Nebulization Q6H PRN Barton Dubois, MD      . magnesium oxide (MAG-OX) tablet 400 mg  400 mg Oral Daily Debbe Odea, MD   400 mg at 11/30/13 1427  . magnesium sulfate IVPB 2 g 50 mL  2 g Intravenous Once Barton Dubois, MD      . midodrine (PROAMATINE) tablet 5 mg  5 mg Oral TID WC Debbe Odea, MD   5 mg at 11/30/13 1700  . multivitamin with minerals tablet 1 tablet  1 tablet Oral Daily Debbe Odea, MD   1 tablet at 11/30/13 1426  . ondansetron (ZOFRAN) tablet 4 mg  4 mg Oral Q6H PRN Barton Dubois, MD       Or  . ondansetron [facility]) injection 4 mg   4 mg Intravenous Q6H PRN Barton Dubois, MD      . ondansetron [facility]) tablet 4 mg  4 mg Oral Q8H PRN Debbe Odea, MD      . oxyCODONE (Oxy IR/ROXICODONE) immediate release tablet 5 mg  5 mg Oral Q4H PRN Debbe Odea, MD      . oxyCODONE (ROXICODONE) 5 MG/5ML solution 5 mg  5 mg Per Tube Q4H PRN Samella Parr, NP      . sennosides-docusate sodium (SENOKOT-S) 8.6-50 MG tablet 1 tablet  1 tablet Oral BID PRN Debbe Odea, MD        REVIEW OF SYSTEMS:  A 15 point review of systems is documented in the electronic medical record. This was obtained by the nursing staff. However, I reviewed this with the patient to discuss relevant findings and make appropriate changes. She denies any pain in the chest or breathing problems. Patient is quite pleasant this evening. She defers any decision-making to her daughter particularly in reference to palliative treatment or hospice care   PHYSICAL EXAM: This is a pleasant 73 year old female lying in a hospital bed. She responds appropriately to questions.  She denies any pain along the chest or abdominal region. Heart monitor shows the patient is in and out of atrial fibrillation. She moves all 4 extremities when asked and appears to have good strength.  The abdomen shows a feeding tube in place.   ECOG = 4  4 - Bedbound (Completely disabled. Cannot carry on any self-care. Totally confined to bed or chair)    LABORATORY DATA:  Lab Results  Component Value Date   WBC 15.1* 11/30/2013   HGB 10.8* 11/30/2013   HCT 34.7* 11/30/2013   MCV 92.3 11/30/2013   PLT 285 11/30/2013   Lab Results  Component Value Date   NA 137 11/30/2013   K 4.9 11/30/2013   CL 107 11/30/2013   CO2 20 11/30/2013   Lab Results  Component Value Date   ALT 14 11/30/2013   AST 14 11/30/2013   ALKPHOS 55 11/30/2013   BILITOT 0.2* 11/30/2013     RADIOGRAPHY: Ct Chest Wo Contrast  11/20/2013   CLINICAL DATA:  Staging for esophageal mass.  EXAM: CT CHEST WITHOUT CONTRAST  TECHNIQUE:  Multidetector CT imaging of the chest was performed following the standard protocol without IV contrast.  COMPARISON:  DG ESOPHAGUS dated 11/16/2013; DG CHEST 1V PORT dated 11/15/2013; CT ABD/PELVIS W CM dated 11/03/2013; CT ABD/PELVIS W CM dated 10/29/2013  FINDINGS: There is some contrast material within the proximal esophageal lumen which is mildly distended by the large distal esophageal mass. The mass itself is not well delineated by this noncontrast examination. The esophagus measures up to 10.5  x 6.4 cm transverse on image 33. There is mass effect on the left atrium, although no clear cardiac involvement. There is mild splaying of the carina. The superior mediastinal fat planes are maintained. No discrete enlarged mediastinal or hilar lymph nodes are identified.  Right arm PICC extends to the mid SVC. There is mild diffuse atherosclerosis of the aorta, great vessels and coronary arteries. There are multiple calcifications throughout the left thyroid lobe associated with a central low density 1.4 cm lesion on image 6. The left lobe is diffusely enlarged.  There are moderate right and small left pleural effusions. The left pleural effusion extends into the superior aspect of the major fissure. No significant pericardial fluid is present.  There is scattered atelectasis in both lungs, primarily adjacent to the pleural effusions. A 3 mm perifissural nodule is noted in the right upper lobe on image 24. No other pulmonary nodules are identified.  The visualized upper abdomen has a stable appearance. Patient has a G-tube. There are no worrisome osseous findings.  IMPRESSION: 1. The large distal esophageal mass is not well characterized by this noncontrast study, although demonstrates no definite extra esophageal extension based on this or the prior abdominal CTs. There is extrinsic mass effect on the left atrium. 2. No distant metastases identified. 3. Persistent bilateral pleural effusions with scattered atelectasis in  both lungs. A small right upper lobe perifissural nodule is nonspecific. 4. Mild diffuse atherosclerosis. 5. Asymmetric enlargement of the left thyroid lobe with calcifications and a low-density nodule. Consider further evaluation with thyroid ultrasound.   Electronically Signed   By: Camie Patience M.D.   On: 11/20/2013 07:52   Ct Abdomen Pelvis W Contrast  11/03/2013   CLINICAL DATA:  Increased white blood cell count. Esophageal mass on prior CT  EXAM: CT ABDOMEN AND PELVIS WITH CONTRAST  TECHNIQUE: Multidetector CT imaging of the abdomen and pelvis was performed using the standard protocol following bolus administration of intravenous contrast.  CONTRAST:  75mL OMNIPAQUE IOHEXOL 300 MG/ML  SOLN  COMPARISON:  CT ABD/PELVIS W CM dated 10/29/2013  FINDINGS: There percutaneous gastrostomy tube in the stomach. Again demonstrated a large mass within the distal esophagus or adjacent to the distal esophagus measuring 6.0 by 7.3 cm. The stomach contains oral contrast. The small bowel and colon appear are normal without evidence obstruction.  Small bilateral effusions are improved slightly from prior. There is mild ground-glass opacity in the right lung base site of prior atelectasis. No clear evidence pneumonia.  No focal hepatic lesion. The gallbladder, pancreas, spleen, adrenal glands, and kidneys are normal. There is mild hydroureter on the left bladder is distended. Mild hydroureter on the right additionally. Abdominal aorta is normal caliber. No retroperitoneal periportal lymphadenopathy.  No free fluid the pelvis. The bladder is distended. The uterus and ovaries are normal. No pelvic lymphadenopathy. No aggressive osseous lesion.  IMPRESSION: 1. Large distal esophageal mass 2. Percutaneous gastrostomy tube in place. No evidence of bowel obstruction. 3. Bilateral hydroureter and bladder distension suggest bladder outlet obstruction or neurogenic bladder. Patient may benefit from Foley catheter. 4. Small bilateral  effusions are improved.   Electronically Signed   By: Suzy Bouchard M.D.   On: 11/03/2013 20:40   Dg Esophagus  11/16/2013   CLINICAL DATA:  Dysphagia.  Esophageal obstruction.  EXAM: ESOPHOGRAM/BARIUM SWALLOW  TECHNIQUE: Single contrast examination was performed using water-soluble agent.  COMPARISON:  DG CHEST 1V PORT dated 11/15/2013  FLUOROSCOPY TIME:  0 min 54 seconds.  FINDINGS: A limited  examination was performed due to patient condition. Patient was placed in the prone LPO position and drank Omnipaque 300. The proximal esophagus is markedly dilated with contrast shouldering a large mass in the mid esophagus. Minimal passage of contrast into the mid/ lower esophagus. There is no contrast seen passing into the stomach.  IMPRESSION: Large obstructing esophageal mass, as partially imaged on 11/03/2013.   Electronically Signed   By: Lorin Picket M.D.   On: 11/16/2013 15:47   Ir Chancy Milroy Francesco Runner Convert Gastr-jej Per W/fl Mod Sed  11/24/2013   CLINICAL DATA:  High gastric residuals. Needs gastrostomy tube converted to a gastrojejunal feeding tube.  EXAM: CONVERSION OF GASTROSTOMY TUBE TO A GASTROJEJUNAL FEEDING TUBE  Physician: Stephan Minister. Henn, MD  MEDICATIONS: None  ANESTHESIA/SEDATION: Moderate sedation time: None  FLUOROSCOPY TIME:  15 min and 6 seconds  PROCEDURE: Informed consent was obtained for this procedure. The existing gastrostomy and surrounding skin were prepped and draped in sterile fashion. Maximal barrier sterile technique was utilized including caps, mask, sterile gowns, sterile gloves, sterile drape, hand hygiene and skin antiseptic. The gastrostomy tube balloon was deflated and the gastrostomy tube was removed. A 5 French catheter was advanced into the stomach. A C2 catheter was used to cannulate the duodenum bulb. No significant contrast or air was draining into the small bowel. Eventually, a Glidewire was advanced into the descending duodenum and distal duodenum. Placement of the 24 [place] GJ  tube was very difficult because the wire repeatedly coiled in the stomach and came out of the small bowel. Eventually, the tube was advanced into the proximal jejunum. The retention balloon was inflated with 8 mL of saline. Contrast injection confirmed placement in the small bowel.  COMPLICATIONS: None  FINDINGS: Catheter tip in the proximal jejunum.  IMPRESSION: Successful conversion of the gastrostomy tube to a gastrojejunal feeding tube.   Electronically Signed   By: Markus Daft M.D.   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 Patient is not a candidate for surgery given her advanced disease and performance status.  She has a poor performance status. The patient would be a candidate for palliative radiation therapy but given her overall situation, supportive care and hospice seems more appropriate.  PLAN: Final details concerning patient's management are pending discussion with family members.     ------------------------------------------------  -----------------------------------  Blair Promise, PhD, MD

## 2013-11-30 NOTE — Consult Note (Signed)
WOC wound consult note Reason for Consult: evaluation of Stage III Pressure ulcer, after talking with staff it is located on the sacrum.  She has rather large buttocks and has a very deep gluteal cleft/crease.  The area is on the sacrum but does appear to be healing and clean Wound type: full thickness, Stage III Pressure ulcer Pressure Ulcer POA: Yes Measurement: 4.0cm x2.0cm x 0.5cm  Wound TOI:ZTIWP, pink, moist, some hypergranulation tissue a bit pale in a portion of the wound Drainage (amount, consistency, odor) minimal, non foul Periwound: intact Dressing procedure/placement/frequency: silver hydrofiber for bioburdan and cover with foam for protection since patient does have some incontience (she was wet with urine when I arrived.  Not clear is she is truly incontinent or just not call for help for bedpan.   Note: Cleaned patient from urine and changed linens and gown.   Discussed POC with patient  Re consult if needed, will not follow at this time. Thanks  Reagyn Facemire Kellogg, [place] [phone])

## 2013-12-01 ENCOUNTER — Inpatient Hospital Stay (HOSPITAL_COMMUNITY): Payer: Medicare Other

## 2013-12-01 LAB — CBC WITH DIFFERENTIAL/PLATELET
BASOS ABS: 0 10*3/uL (ref 0.0–0.1)
BASOS PCT: 0 % (ref 0–1)
Eosinophils Absolute: 0.3 10*3/uL (ref 0.0–0.7)
Eosinophils Relative: 2 % (ref 0–5)
HEMATOCRIT: 32.8 % — AB (ref 36.0–46.0)
Hemoglobin: 10.2 g/dL — ABNORMAL LOW (ref 12.0–15.0)
Lymphocytes Relative: 18 % (ref 12–46)
Lymphs Abs: 2.8 10*3/uL (ref 0.7–4.0)
MCH: 28.9 pg (ref 26.0–34.0)
MCHC: 31.1 g/dL (ref 30.0–36.0)
MCV: 92.9 fL (ref 78.0–100.0)
Monocytes Absolute: 1.7 10*3/uL — ABNORMAL HIGH (ref 0.1–1.0)
Monocytes Relative: 11 % (ref 3–12)
Neutro Abs: 10.9 10*3/uL — ABNORMAL HIGH (ref 1.7–7.7)
Neutrophils Relative %: 70 % (ref 43–77)
Platelets: 285 10*3/uL (ref 150–400)
RBC: 3.53 MIL/uL — ABNORMAL LOW (ref 3.87–5.11)
RDW: 16 % — AB (ref 11.5–15.5)
WBC: 15.6 10*3/uL — ABNORMAL HIGH (ref 4.0–10.5)

## 2013-12-01 LAB — GLUCOSE, CAPILLARY
GLUCOSE-CAPILLARY: 100 mg/dL — AB (ref 70–99)
GLUCOSE-CAPILLARY: 102 mg/dL — AB (ref 70–99)
GLUCOSE-CAPILLARY: 126 mg/dL — AB (ref 70–99)
Glucose-Capillary: 70 mg/dL (ref 70–99)
Glucose-Capillary: 68 mg/dL — ABNORMAL LOW (ref 70–99)
Glucose-Capillary: 64 mg/dL — ABNORMAL LOW (ref 70–99)
Glucose-Capillary: 54 mg/dL — ABNORMAL LOW (ref 70–99)
Glucose-Capillary: 85 mg/dL (ref 70–99)

## 2013-12-01 LAB — BASIC METABOLIC PANEL
BUN: 9 mg/dL (ref 6–23)
CO2: 20 mEq/L (ref 19–32)
CREATININE: 0.59 mg/dL (ref 0.50–1.10)
Calcium: 10.1 mg/dL (ref 8.4–10.5)
Chloride: 113 mEq/L — ABNORMAL HIGH (ref 96–112)
GFR, EST NON AFRICAN AMERICAN: 88 mL/min — AB (ref >90)
Glucose, Bld: 91 mg/dL (ref 70–99)
Potassium: 3.9 mEq/L (ref 3.7–5.3)
Sodium: 144 mEq/L (ref 137–147)

## 2013-12-01 LAB — URINE CULTURE

## 2013-12-01 LAB — CORTISOL: CORTISOL PLASMA: 18.1 ug/dL

## 2013-12-01 MED ORDER — AMIODARONE HCL IN DEXTROSE 360-4.14 MG/200ML-% IV SOLN
30.0000 mg/h | INTRAVENOUS | Status: DC
  Filled 2013-12-01: qty 200

## 2013-12-01 MED ORDER — AMIODARONE HCL 200 MG PO TABS
200.0000 mg | ORAL_TABLET | Freq: Two times a day (BID) | ORAL | Status: DC
  Administered 2013-12-01 – 2013-12-08 (×14): 200 mg via ORAL
  Filled 2013-12-01 (×16): qty 1

## 2013-12-01 MED ORDER — AMIODARONE HCL IN DEXTROSE 360-4.14 MG/200ML-% IV SOLN
60.0000 mg/h | INTRAVENOUS | Status: DC
  Filled 2013-12-01 (×2): qty 200

## 2013-12-01 MED ORDER — DEXTROSE 5 % IV SOLN
1.0000 g | INTRAVENOUS | Status: DC
  Administered 2013-12-01: 1 g via INTRAVENOUS
  Filled 2013-12-01 (×2): qty 10

## 2013-12-01 MED ORDER — ALTEPLASE 2 MG IJ SOLR
4.0000 mg | Freq: Once | INTRAMUSCULAR | Status: AC
  Administered 2013-12-01: 4 mg
  Filled 2013-12-01: qty 4

## 2013-12-01 MED ORDER — ALTEPLASE 2 MG IJ SOLR: 2.0000 mg | Freq: Once | INTRAMUSCULAR | Status: DC

## 2013-12-01 MED ORDER — DEXTROSE 50 % IV SOLN
INTRAVENOUS | Status: AC
  Administered 2013-12-01: 25 mL
  Filled 2013-12-01: qty 50

## 2013-12-01 MED ORDER — SODIUM CHLORIDE 0.45 % IV SOLN
INTRAVENOUS | Status: DC
  Administered 2013-12-01 – 2013-12-03 (×2): via INTRAVENOUS

## 2013-12-01 NOTE — Progress Notes (Addendum)
[facility] Progress Note [place] TEAM 1 - Stepdown/ICU TEAM   Heidi Hall JKD:[id number] DOB: Dec 04, 1940 DOA: 11/29/2013 PCP: No PCP Per Patient  Brief narrative: Heidi Hall is a 73 y.o. female presenting on 11/29/2013 with PMH of esophageal cancer diagnosed in 12/14, atrial flutter/atrial fibrillation, sacral decubitus ulcer, hx of perforated viscous after a PEG placement, C diff last month and deconditioning. The patient was transferred from [facility] to [facility] where she has been for about 1 month. 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 A PEG tube was placed which resulted in perforation, acute peritonitis and vent dependent respiratory failure. She was found to have sputum positive for E. Coli and also developed C diff colitis which has been completely treated. She was referred [facility] and was planned to discharge from there on 12/11. However she was found to go into SVT and an ECHO was obtained which revealed a mass compressing the left atrium. She was sent to [facility] for further work up. Oncology and CT surgery was consulted.   Subjective: Pt has no complaints today. Does state that she dose not want "to be cut" but will consider chemo and radiation. Would like further discussion with Dr Marin Olp when daughter present tomorrow  Assessment/Plan: Principal Problem:   Esophageal cancer- 10 cm - not a surgical candidate for resection or stenting per CT surgery and palliative care has been suggested by Dr Nils Pyle - Dr Marin Olp discussing palliative chemo/radiation with the patient once he is able to obtain the path report. She would like him to further discuss it with herself in the presence of her daughter (lives 1 hr away and will be back tomorrow)   Active Problems:   Atrial flutter - currently sinus rhythm - cont Amiodarone - avoid full anticoagulation due to cancerous mass in  esophagus  Hypotension - obtain AM cortisol - hydrate  - holding B Blocker    Hx of Clostridium difficile infection - treated and no curren symptoms of diarrhea - repeat C diff negative - isolation to continue for a total of 30 days - d/c Protonix and start Pepcid  Leukocytosis - follow- no fever- diff reveals elevated Neutrophils - RN noted foul smelling discharge at PEG site- will start Rocephin- recommend about 5 days.   Pressure ulcer Stage 3- sacral - WOC eval    Hypercalcemia- mild- mod - asymptomatic -maintain hydration- cont IVF until certain that tube feeds are being tolerated  Nutrition - high residuals - obtained abdominal xray which does not reveal any abnormality - will resume tube feed and follow - cont clears for pleasure- med through PEG   Code Status: Full code Family Communication: with daughter at bedside on 2/12 Disposition Plan: tx to med/surg  Consultants: Onc CT surgery  Procedures: none  Antibiotics: none  DVT prophylaxis: Heparin  Objective: Filed Weights   11/29/13 1800 11/30/13 0500 12/01/13 0500  Weight: 73.6 kg (162 lb 4.1 oz) 74 kg (163 lb 2.3 oz) 74.2 kg (163 lb 9.3 oz)   Blood pressure 80/43, pulse 60, temperature 97.8 F (36.6 C), temperature source Oral, resp. rate 19, height 5\' 2"  (1.575 m), weight 74.2 kg (163 lb 9.3 oz), SpO2 98.00%.  Intake/Output Summary (Last 24 hours) at 12/01/13 1444 Last data filed at 12/01/13 1200  Gross per 24 hour  Intake 2604.5 ml  Output    350 ml  Net 2254.5 ml     Exam: General:  No acute respiratory distress- AAO x 3 Lungs: Clear to auscultation bilaterally without wheezes or crackles Cardiovascular: Regular rate and rhythm without murmur gallop or rub normal S1 and S2 Abdomen: Nontender, nondistended, soft, bowel sounds positive, no rebound, no ascites, no appreciable mass- PEG intact- dried blood and discharge on dressing around PEG- foul odor noted. No pus noted,  Extremities: No  significant cyanosis, clubbing, or edema bilateral lower extremities  Data Reviewed: Basic Metabolic Panel:  Recent Labs Lab 11/27/13 0500 11/28/13 0618 11/29/13 1005 11/30/13 0500 12/01/13 1025  NA 139 150* 142 137 144  K 3.3* 3.5* 3.6* 4.9 3.9  CL 108 117* 109 107 113*  CO2 20 20 20 20 20   GLUCOSE 109* 125* 114* 323* 91  BUN 6 9 7 7 9   CREATININE 0.51 0.57 0.43* 0.44* 0.59  CALCIUM 9.1 10.0 10.0 9.4 10.1  MG  --   --   --  2.0  --    Liver Function Tests:  Recent Labs Lab 11/30/13 0500  AST 14  ALT 14  ALKPHOS 55  BILITOT 0.2*  PROT 5.7*  ALBUMIN 1.7*   No results found for this basename: LIPASE, AMYLASE,  in the last 168 hours No results found for this basename: AMMONIA,  in the last 168 hours CBC:  Recent Labs Lab 11/30/13 0500 12/01/13 1025  WBC 15.1* 15.6*  NEUTROABS  --  10.9*  HGB 10.8* 10.2*  HCT 34.7* 32.8*  MCV 92.3 92.9  PLT 285 285   Cardiac Enzymes: No results found for this basename: CKTOTAL, CKMB, CKMBINDEX, TROPONINI,  in the last 168 hours BNP (last 3 results) No results found for this basename: PROBNP,  in the last 8760 hours CBG:  Recent Labs Lab 11/30/13 1804 11/30/13 2145 12/01/13 0346 12/01/13 0831 12/01/13 1158  GLUCAP 82 75 100* 102* 70    Recent Results (from the past 240 hour(s))  CLOSTRIDIUM DIFFICILE BY PCR     Status: None   Collection Time    11/28/13 11:43 AM      Result Value Ref Range Status   C difficile by pcr NEGATIVE  NEGATIVE Final  MRSA PCR SCREENING     Status: None   Collection Time    11/29/13  5:36 PM      Result Value Ref Range Status   MRSA by PCR NEGATIVE  NEGATIVE Final   Comment:            The GeneXpert MRSA Assay (FDA     approved for NASAL specimens     only), is one component of a     comprehensive MRSA colonization     surveillance program. It is not     intended to diagnose MRSA     infection nor to guide or     monitor treatment for     MRSA infections.  URINE CULTURE      Status: None   Collection Time    11/30/13  2:42 PM      Result Value Ref Range Status   Specimen Description URINE, RANDOM   Final   Special Requests NONE   Final   Culture  Setup Time     Final   Value: 11/30/2013 19:58     Performed at [place]     Final   Value: 70,000 COLONIES/ML     Performed at [facility]   Culture     Final   Value: Multiple bacterial morphotypes present,  none predominant. Suggest appropriate recollection if clinically indicated.     Performed at [facility]   Report Status 12/01/2013 FINAL   Final     Studies:  Recent x-ray studies have been reviewed in detail by the Attending Physician  Scheduled Meds:  Scheduled Meds: . alteplase  4 mg Intracatheter Once  . amiodarone  200 mg Oral BID  . amiodarone  200 mg Oral BID  . famotidine (PEPCID) IV  20 mg Intravenous Q12H  . feeding supplement (PRO-STAT SUGAR FREE 64)  30 mL Oral Daily  . folic acid  1 mg Oral Daily  . heparin  5,000 Units Subcutaneous 3 times per day  . magnesium oxide  400 mg Oral Daily  . magnesium sulfate 1 - 4 g bolus IVPB  2 g Intravenous Once  . midodrine  5 mg Oral TID WC  . multivitamin with minerals  1 tablet Oral Daily   Continuous Infusions: . feeding supplement (OSMOLITE 1.2 CAL) Stopped (12/01/13 0900)    Time spent on care of this patient: >35 min   Debbe Odea, MD  [facility] Office  [phone] Pager - Text Page per Shea Evans as per below:  On-Call/Text Page:      [email or web address]      password [email or web address]  If 7PM-7AM, please contact night-coverage [email or web address] Password [email or web address] 12/01/2013, 2:44 PM   LOS: 2 days

## 2013-12-01 NOTE — Evaluation (Signed)
Clinical/Bedside Swallow Evaluation Patient Details  Name: Heidi Hall MRN: [id number] Date of Birth: 12-Sep-1941  Today's Date: 12/01/2013 Time: 0910-0930 SLP Time Calculation (min): 20 min  Past Medical History: History reviewed. No pertinent past medical history. Past Surgical History:  Past Surgical History  Procedure Laterality Date  . Diagnostic laparoscopy     HPI:  Heidi Hall is a 73 y.o. female with pmh of esophageal cancer, atrial flutter/atrial fibrillation, sacral decubitus ulcer, hx of perforated viscous and deconditioning; [facility] about [facility] month. Patient is now transferred to [place] for further evaluation and treatment given findings of 10 cm esophageal mass pressing on her heart. She is not a candidate for surgican intervention. She has a PEG tube placed for severe malnutrition. Heidi Hall reports painful swallowing and struggles to manage her saliva. There is a question of being allowed clears on [facility].    Assessment / Plan / Recommendation Clinical Impression  Heidi Hall presents with primary esophageal dysphagia secondary to esophageal mass. Heidi Hall reports that prior to hospitalization and PEG she was primarily relying on liquids with frequent regurgitation. She knows to sit fully upright and takes small sips, uses oral suction to caputre regurgitated liquids. She does not demonstrate significant discomfort or evidence of aspiration, but she does verbalize relief and increased comfort with liquid diet. Recommend beginning a clear liquid diet for pts pleasure with nutrition and medication being given through PEG. Discussed with RN. Heidi Hall already aware of precautions. Therapy unlikely to be of benefit to Heidi Hall. Education complete, will sign off.     Aspiration Risk  Moderate    Diet Recommendation Thin liquid   Liquid Administration via: Straw;Cup Medication Administration: Via alternative means Supervision: Patient able to self feed;Staff to assist with self feeding Compensations: Slow  rate;Small sips/bites Postural Changes and/or Swallow Maneuvers: Seated upright 90 degrees;Upright 30-60 min after meal    Other  Recommendations Oral Care Recommendations: Oral care Q4 per protocol Other Recommendations: Have oral suction available   Follow Up Recommendations  None    Frequency and Duration        Pertinent Vitals/Pain NA    SLP Swallow Goals     Swallow Study Prior Functional Status       General HPI: Heidi Hall is a 73 y.o. female with pmh of esophageal cancer, atrial flutter/atrial fibrillation, sacral decubitus ulcer, hx of perforated viscous and deconditioning; ended on [facility] for about 1 month. Patient is now transferred to [place] for further evaluation and treatment given findings of 10 cm esophageal mass pressing on her heart. She is not a candidate for surgican intervention. She has a PEG tube placed for severe malnutrition. Heidi Hall reports painful swallowing and struggles to manage her saliva. There is a question of being allowed clears on [facility].  Type of Study: Bedside swallow evaluation Previous Swallow Assessment: [facility] - clear liquids Diet Prior to this Study: NPO;PEG tube Temperature Spikes Noted: No Behavior/Cognition: Alert;Cooperative;Pleasant mood Oral Cavity - Dentition: Missing dentition;Dentures, not available Self-Feeding Abilities: Able to feed self Patient Positioning: Upright in bed Baseline Vocal Quality: Clear Volitional Cough: Strong Volitional Swallow: Able to elicit    Oral/Motor/Sensory Function Overall Oral Motor/Sensory Function: Appears within functional limits for tasks assessed   Ice Chips Ice chips: Not tested   Thin Liquid Thin Liquid: Impaired Presentation: Cup;Straw Other Comments: globus, eventual regurgitation    Nectar Thick Nectar Thick Liquid: Not tested   Honey Thick Honey Thick Liquid: Not tested   Puree Puree: Not tested   Solid  GO    Solid: Not tested      Herbie Baltimore, MA CCC-SLP [phone]  Lynann Beaver 12/01/2013,9:40 AM

## 2013-12-01 NOTE — Progress Notes (Signed)
Pt converted back in atrial fibrillation with HR sustaining in the 130's-140's. RN paged Floor coverage. RN spoke with Jonette Eva. Orders given for amiodarone drip. Before amiodarone could come from pharmacy pt converted back into NSR HR 80's-90's. RN paged floor coverage again and received order to d/c amiodarone drip and place pt back on oral amiodarone. Will continue to monitor, and notify if any further changes.   Coolidge Breeze, RN   .

## 2013-12-01 NOTE — Progress Notes (Signed)
NUTRITION FOLLOW UP  Intervention:   1. Enteral nutrition; resume TF.  Initiate Osmolite 1.2 @ 35 mL/hr continuous. Advance by 10 mL q 4 hrs to 70 mL/hr goal with Prostat once daily to provide 2116 kcal, 108g protein, 1377 mL free water. Will hold prostat today and initiate tomorrow.   Nutrition Dx:   Inadequate oral intake, ongoing  Monitor:   1. Enteral nutrition; initiation with tolerance. Pt to meet >/=90% estimated needs with nutrition support.  2. Wt/wt change; monitor trends  Assessment:   Pt with h/o esophageal cancer admitted with a fib. Pt with h/o sacral decubitus ulcer, perforated viscous and deconditioning. She is s/p G-tube placement and stent at [place] regimen (as recently as 2/6): TwoCal HN @ 35 mL/hr with Prostat 30 mL daily and 95 mL water flushes q 2 hrs providing 1780 kcal, 85g protein, and 1728 mL free water.  Pt with ongoing wt loss: 182 lbs (1/5), 178 lbs (1/12), 173 lbs (1/20), 165 lbs (2/6), and 163 lbs on admission. RD notes pt currently undergoing radiation therapy.  Pt with variable intake over the past 1-2 months, overall insufficient to maintain wt.  Pt started TFs at goal rate of 70 mL/hr overnight instead of ramping up.  This morning, pt had increased residuals and stool-like output from PEG.  KUB was obtained and showed no obstruction or ileus.  Pt has had some diarrhea per RN. Per RN, just received order to resume TFs.  Will extend incremental advancements to q 6 hrs.  Pt has also started a clear liquid diet today after assessment by SLP.  Height: Ht Readings from Last 1 Encounters:  11/29/13 5\' 2"  (1.575 m)    Weight Status:   Wt Readings from Last 1 Encounters:  12/01/13 163 lb 9.3 oz (74.2 kg)    Re-estimated needs:  Kcal: 2050-2200  Protein: 95-115g  Fluid: >2.0 L/day  Skin: intact  Diet Order: Clear Liquid   Intake/Output Summary (Last 24 hours) at 12/01/13 1549 Last data filed at 12/01/13 1200  Gross per 24 hour   Intake 2504.5 ml  Output    350 ml  Net 2154.5 ml    Last BM: 2/12  Labs:   Recent Labs Lab 11/29/13 1005 11/30/13 0500 12/01/13 1025  NA 142 137 144  K 3.6* 4.9 3.9  CL 109 107 113*  CO2 20 20 20   BUN 7 7 9   CREATININE 0.43* 0.44* 0.59  CALCIUM 10.0 9.4 10.1  MG  --  2.0  --   GLUCOSE 114* 323* 91    CBG (last 3)   Recent Labs  12/01/13 0346 12/01/13 0831 12/01/13 1158  GLUCAP 100* 102* 70    Scheduled Meds: . amiodarone  200 mg Oral BID  . amiodarone  200 mg Oral BID  . cefTRIAXone (ROCEPHIN)  IV  1 g Intravenous Q24H  . famotidine (PEPCID) IV  20 mg Intravenous Q12H  . feeding supplement (PRO-STAT SUGAR FREE 64)  30 mL Oral Daily  . folic acid  1 mg Oral Daily  . heparin  5,000 Units Subcutaneous 3 times per day  . magnesium oxide  400 mg Oral Daily  . magnesium sulfate 1 - 4 g bolus IVPB  2 g Intravenous Once  . midodrine  5 mg Oral TID WC  . multivitamin with minerals  1 tablet Oral Daily    Continuous Infusions: . sodium chloride    . feeding supplement (OSMOLITE 1.2 CAL) Stopped (12/01/13 0900)  Brynda Greathouse, MS RD LDN Clinical Inpatient Dietitian Pager: [phone] Weekend/After hours pager: [phone]

## 2013-12-01 NOTE — Progress Notes (Signed)
Tube feeding on hold this am due to 450cc residual. G Tube found with large amount of light brown stool/tube feeding noted, top of G Tube cap found off. Pt states that abdomen is tender. Dressing around insertion site changed noted to have moderate amount of purulent drainage. Dressing changed. Dr Wynelle Cleveland notified orders given.

## 2013-12-02 LAB — GLUCOSE, CAPILLARY
GLUCOSE-CAPILLARY: 95 mg/dL (ref 70–99)
GLUCOSE-CAPILLARY: 117 mg/dL — AB (ref 70–99)
Glucose-Capillary: 88 mg/dL (ref 70–99)
Glucose-Capillary: 117 mg/dL — ABNORMAL HIGH (ref 70–99)

## 2013-12-02 LAB — CBC
HCT: 31.4 % — ABNORMAL LOW (ref 36.0–46.0)
Hemoglobin: 9.8 g/dL — ABNORMAL LOW (ref 12.0–15.0)
MCH: 28.9 pg (ref 26.0–34.0)
MCHC: 31.2 g/dL (ref 30.0–36.0)
MCV: 92.6 fL (ref 78.0–100.0)
Platelets: 283 10*3/uL (ref 150–400)
RBC: 3.39 MIL/uL — AB (ref 3.87–5.11)
RDW: 15.9 % — AB (ref 11.5–15.5)
WBC: 16.2 10*3/uL — ABNORMAL HIGH (ref 4.0–10.5)

## 2013-12-02 MED ORDER — SODIUM CHLORIDE 0.9 % IJ SOLN: 10.0000 mL | Freq: Two times a day (BID) | INTRAMUSCULAR | Status: DC

## 2013-12-02 MED ORDER — SODIUM CHLORIDE 0.9 % IJ SOLN
10.0000 mL | INTRAMUSCULAR | Status: DC | PRN
  Administered 2013-12-02: 10 mL
  Administered 2013-12-03: 40 mL
  Administered 2013-12-05 – 2013-12-06 (×3): 10 mL
  Administered 2013-12-06: 20 mL
  Administered 2013-12-08: 30 mL

## 2013-12-02 NOTE — Progress Notes (Signed)
Hypoglycemic Event  CBG: 54  Treatment: D50 IV 25 mL  Symptoms: None  Follow-up CBG: Time:2115 CBG Result:126  Possible Reasons for Event: Unknown    Mar Daring R  Remember to initiate Hypoglycemia Order Set & complete

## 2013-12-02 NOTE — Progress Notes (Signed)
Pt going in and out of A-fib with HR spikes up to the 150's. RN paged and notified M. Lynch. No new orders given. Will continue to monitor pt closely.   Coolidge Breeze, RN

## 2013-12-02 NOTE — Progress Notes (Addendum)
[facility] Progress Note Team 1 transfer   Heidi Hall ERX:[id number] DOB: October 08, 1941 DOA: 11/29/2013 PCP: No PCP Per Patient  Brief narrative: Heidi Hall is a 73 y.o. female presenting on 11/29/2013 with PMH of esophageal cancer diagnosed in 12/14, atrial flutter/atrial fibrillation, sacral decubitus ulcer, hx of perforated viscous after a PEG placement, C diff last month and deconditioning. The patient was transferred from [facility] to [place] where she has been for about 1 month. 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 A PEG tube was placed which resulted in perforation, acute peritonitis and vent dependent respiratory failure. She was found to have sputum positive for E. Coli and also developed C diff colitis which has been completely treated. She was referred to [facility] and was planned to discharge from there on 12/11. However she was found to go into SVT and an ECHO was obtained which revealed a mass compressing the left atrium. She was sent to [facility] for further work up. Oncology and CT surgery was consulted.   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 - not a surgical candidate for resection or stenting per CT surgery and palliative care has been recommended by Dr Prescott Gum - Dr Marin Olp following, awaiting discussion with daughter - with her very poor functional status, intermittent arrhythmias, low BP, malnutrition, recent major complications/perforated viscus I think Palliative care if the most appropriate option   Atrial flutter - currently sinus rhythm, with bursts of RVR - cont Amiodarone, options limited due to low baseline BP - avoid full anticoagulation due to large esoph mass  Hypotension - AM cortisol within normal limits - hydrate  - holding B Blocker    Hx of Clostridium  difficile infection - treated and no curren symptoms of diarrhea - repeat C diff negative - isolation to continue for a total of 30 days - stopped Protonix and started Pepcid  Leukocytosis - follow- no fever- diff reveals elevated Neutrophils - Cdiff negative - RN noted foul smelling discharge at PEG site- -stop ceftriaxone and monitor  STage 3 sacral decubitus ulcer - WOC eval    Hypercalcemia- mild- mod - corrected -maintain hydration- cont IVF until certain that tube feeds are being tolerated  Dysphagia due to 1 -continue TFs - cont clears for pleasure- med through PEG   Code Status: Full code Family Communication: called and d/w Dr.Ennever Disposition Plan: pending further stabilization  Consultants: Onc CT surgery  Procedures: none  Antibiotics: none  DVT prophylaxis: Heparin  Objective: Filed Weights   11/30/13 0500 12/01/13 0500 12/02/13 0429  Weight: 74 kg (163 lb 2.3 oz) 74.2 kg (163 lb 9.3 oz) 73.1 kg (161 lb 2.5 oz)   Blood pressure 84/53, pulse 82, temperature 98.4 F (36.9 C), temperature source Oral, resp. rate 18, height 5\' 2"  (1.575 m), weight 73.1 kg (161 lb 2.5 oz), SpO2 96.00%.  Intake/Output Summary (Last 24 hours) at 12/02/13 0950 Last data filed at 12/01/13 1200  Gross per 24 hour  Intake      0 ml  Output    200 ml  Net   -200 ml     Exam: General: No acute respiratory distress- AAO x 3 Lungs: Clear to auscultation bilaterally without wheezes or crackles Cardiovascular: Regular rate and rhythm without murmur gallop or rub normal S1 and S2 Abdomen: Nontender, nondistended, soft, bowel sounds positive, no rebound, no ascites,  no appreciable mass- PEG intact- dried blood and discharge on dressing around PEG- foul odor noted. No pus noted,  Extremities: No significant cyanosis, clubbing, or edema bilateral lower extremities  Data Reviewed: Basic Metabolic Panel:  Recent Labs Lab 11/27/13 0500 11/28/13 0618 11/29/13 1005  11/30/13 0500 12/01/13 1025  NA 139 150* 142 137 144  K 3.3* 3.5* 3.6* 4.9 3.9  CL 108 117* 109 107 113*  CO2 20 20 20 20 20   GLUCOSE 109* 125* 114* 323* 91  BUN 6 9 7 7 9   CREATININE 0.51 0.57 0.43* 0.44* 0.59  CALCIUM 9.1 10.0 10.0 9.4 10.1  MG  --   --   --  2.0  --    Liver Function Tests:  Recent Labs Lab 11/30/13 0500  AST 14  ALT 14  ALKPHOS 55  BILITOT 0.2*  PROT 5.7*  ALBUMIN 1.7*   No results found for this basename: LIPASE, AMYLASE,  in the last 168 hours No results found for this basename: AMMONIA,  in the last 168 hours CBC:  Recent Labs Lab 11/30/13 0500 12/01/13 1025 12/02/13 0515  WBC 15.1* 15.6* 16.2*  NEUTROABS  --  10.9*  --   HGB 10.8* 10.2* 9.8*  HCT 34.7* 32.8* 31.4*  MCV 92.3 92.9 92.6  PLT 285 285 283   Cardiac Enzymes: No results found for this basename: CKTOTAL, CKMB, CKMBINDEX, TROPONINI,  in the last 168 hours BNP (last 3 results) No results found for this basename: PROBNP,  in the last 8760 hours CBG:  Recent Labs Lab 12/01/13 2014 12/01/13 2053 12/01/13 2115 12/01/13 2259 12/02/13 0427  GLUCAP 64* 54* 126* 85 88    Recent Results (from the past 240 hour(s))  CLOSTRIDIUM DIFFICILE BY PCR     Status: None   Collection Time    11/28/13 11:43 AM      Result Value Ref Range Status   C difficile by pcr NEGATIVE  NEGATIVE Final  MRSA PCR SCREENING     Status: None   Collection Time    11/29/13  5:36 PM      Result Value Ref Range Status   MRSA by PCR NEGATIVE  NEGATIVE Final   Comment:            The GeneXpert MRSA Assay (FDA     approved for NASAL specimens     only), is one component of a     comprehensive MRSA colonization     surveillance program. It is not     intended to diagnose MRSA     infection nor to guide or     monitor treatment for     MRSA infections.  URINE CULTURE     Status: None   Collection Time    11/30/13  2:42 PM      Result Value Ref Range Status   Specimen Description URINE, RANDOM    Final   Special Requests NONE   Final   Culture  Setup Time     Final   Value: 11/30/2013 19:58     Performed at [place]     Final   Value: 70,000 COLONIES/ML     Performed at [facility]   Culture     Final   Value: Multiple bacterial morphotypes present, none predominant. Suggest appropriate recollection if clinically indicated.     Performed at [facility]   Report Status 12/01/2013 FINAL   Final     Studies:  Recent x-ray studies have been reviewed in detail by the Attending Physician  Scheduled Meds:  Scheduled Meds: . amiodarone  200 mg Oral BID  . cefTRIAXone (ROCEPHIN)  IV  1 g Intravenous Q24H  . famotidine (PEPCID) IV  20 mg Intravenous Q12H  . feeding supplement (PRO-STAT SUGAR FREE 64)  30 mL Oral Daily  . folic acid  1 mg Oral Daily  . heparin  5,000 Units Subcutaneous 3 times per day  . magnesium oxide  400 mg Oral Daily  . magnesium sulfate 1 - 4 g bolus IVPB  2 g Intravenous Once  . midodrine  5 mg Oral TID WC  . multivitamin with minerals  1 tablet Oral Daily  . sodium chloride  10-40 mL Intracatheter Q12H   Continuous Infusions: . sodium chloride 75 mL/hr at 12/01/13 1751  . feeding supplement (OSMOLITE 1.2 CAL) 1,000 mL (12/02/13 0525)    Time spent on care of this patient: >35 min   Domenic Polite, MD  [facility] Office  [phone] Pager - Text Page per Shea Evans as per below:  On-Call/Text Page:      [email or web address]      password [email or web address]  If 7PM-7AM, please contact night-coverage [email or web address] Password [email or web address] 12/02/2013, 9:50 AM   LOS: 3 days

## 2013-12-02 NOTE — Progress Notes (Signed)
During assessment RN noticed an open wound around pt umbilicus with a scant amount of purulent yellowish drainage. RN placed a foam dressing over wound. Will continue to monitor and follow up with day shift in the am.   Coolidge Breeze, RN

## 2013-12-02 NOTE — Evaluation (Signed)
Physical Therapy Evaluation Patient Details Name: Heidi Hall MRN: [id number] DOB: 05/20/1941 Today's Date: 12/02/2013 Time: 4034-7425 PT Time Calculation (min): 40 min  PT Assessment / Plan / Recommendation History of Present Illness   Heidi Hall is a 73 y.o. female with pmh of esophageal cancer, atrial flutter/atrial fibrillation, sacral decubitus ulcer, hx of perforated viscous and deconditioning; [facility] about [facility] month. Patient is now transferred to [place] for further evaluation and treatment given findings of esophageal mass pressing on her heart and to receive further staging/treatment from oncology service. Patient denies any CP, SOB, fever, cough, hematemesis, melena or any other complaints currently. She has been hypercalcemic and with intermittent episodes of SVT. Currently calcium stable and rate is 69. Please referred to [facility] from [facility] for further info/details on her history prior and during admission there.  Clinical Impression  Pt admitted with/for for assessment and management of esophageal mass pressing on pt's heart.  Pt currently limited functionally due to the problems listed. ( See problems list.)   Pt will benefit from PT to maximize function and safety in order to get ready for next venue listed below.     PT Assessment  Patient needs continued PT services    Follow Up Recommendations  SNF (or back to [facility])    Does the patient have the potential to tolerate intense rehabilitation      Barriers to Discharge Decreased caregiver support      Equipment Recommendations  Other (comment) (TBA, pt poor historian)    Recommendations for Other Services     Frequency Min 3X/week    Precautions / Restrictions Precautions Precautions: Fall   Pertinent Vitals/Pain sats 94% on RA and HR in the 90's      Mobility  Bed Mobility Overal bed mobility: Needs Assistance Bed Mobility: Supine to Sit Supine to sit: Min assist;HOB elevated General  bed mobility comments: bridging with min assist, truncal assist to sit up and min guard for scooting to EOB Transfers Overall transfer level: Needs assistance Transfers: Sit to/from Stand Sit to Stand: Mod assist General transfer comment: assist to come forward and lifting assist    Exercises     PT Diagnosis: Difficulty walking;Generalized weakness  PT Problem List: Decreased strength;Decreased activity tolerance;Decreased balance;Decreased mobility;Decreased knowledge of use of DME;Cardiopulmonary status limiting activity PT Treatment Interventions: DME instruction;Gait training;Functional mobility training;Therapeutic activities;Patient/family education     PT Goals(Current goals can be found in the care plan section) Acute Rehab PT Goals Patient Stated Goal: get back home; be able to move better PT Goal Formulation: With patient Time For Goal Achievement: 12/16/13 Potential to Achieve Goals: Fair  Visit Information  Last PT Received On: 12/02/13 Assistance Needed: +1 History of Present Illness:  Heidi Hall is a 73 y.o. female with pmh of esophageal cancer, atrial flutter/atrial fibrillation, sacral decubitus ulcer, hx of perforated viscous and deconditioning; [facility] about [facility]. Patient is now transferred to [place] for further evaluation and treatment given findings of esophageal mass pressing on her heart and to receive further staging/treatment from oncology service. Patient denies any CP, SOB, fever, cough, hematemesis, melena or any other complaints currently. She has been hypercalcemic and with intermittent episodes of SVT. Currently calcium stable and rate is 69. Please referred to [facility] for further info/details on her history prior and during admission there.       Prior Functioning  Home Living Family/patient expects to be discharged to:: Unsure Living Arrangements:  (  lives with grand daughter, daugther has POA) Additional  Comments: pt is a poor historian as to sequence of events and time frames Prior Function Level of Independence: Needs assistance Communication Communication: No difficulties    Cognition  Cognition Arousal/Alertness: Awake/alert Behavior During Therapy: WFL for tasks assessed/performed Overall Cognitive Status: No family/caregiver present to determine baseline cognitive functioning    Extremity/Trunk Assessment Upper Extremity Assessment Upper Extremity Assessment: Generalized weakness Lower Extremity Assessment Lower Extremity Assessment: Generalized weakness;RLE deficits/detail RLE Deficits / Details: R LE weaker than left,  stiff and painful knee with moderate flexion   Balance Balance Overall balance assessment: Needs assistance Sitting-balance support: No upper extremity supported Sitting balance-Heidi Hall Scale: Good Standing balance support: Bilateral upper extremity supported;During functional activity Standing balance-Heidi Hall Scale: Fair Standing balance comment: difficultey w/shift and difficult but able to march in place with min assist General Comments General comments (skin integrity, edema, etc.): standing x2 for 30-45 secs each for pregait work--w/shift, stepping and balance  End of Session PT - End of Session Activity Tolerance: Patient tolerated treatment well;Patient limited by fatigue Patient left: in bed;with call bell/phone within reach Nurse Communication: Mobility status  GP     [name], Tessie Fass 12/02/2013, 3:48 PM  12/02/2013  Donnella Sham, [place] [phone]  (pager)

## 2013-12-02 NOTE — Progress Notes (Signed)
Pt had zero cc of residual in G-tube. RN increased pt feeding tube up by 10cc/hr to 20cc/hr. Will continue to monitor and check residual Q4hrs.Pt resting comfortably with call bell within reach.   Coolidge Breeze, RN

## 2013-12-02 NOTE — Progress Notes (Signed)
Hypoglycemic Event  CBG: 64  Treatment: 15 GM carbohydrate snack  Symptoms: None  Follow-up CBG: Time:2053 CBG Result:54  Possible Reasons for Event: Unknown   Heidi Hall  Remember to initiate Hypoglycemia Order Set & complete

## 2013-12-03 ENCOUNTER — Inpatient Hospital Stay (HOSPITAL_COMMUNITY): Payer: Medicare Other

## 2013-12-03 LAB — GLUCOSE, CAPILLARY
GLUCOSE-CAPILLARY: 111 mg/dL — AB (ref 70–99)
GLUCOSE-CAPILLARY: 122 mg/dL — AB (ref 70–99)
Glucose-Capillary: 109 mg/dL — ABNORMAL HIGH (ref 70–99)
Glucose-Capillary: 110 mg/dL — ABNORMAL HIGH (ref 70–99)
Glucose-Capillary: 140 mg/dL — ABNORMAL HIGH (ref 70–99)

## 2013-12-03 LAB — CBC
HEMATOCRIT: 32.3 % — AB (ref 36.0–46.0)
Hemoglobin: 10.1 g/dL — ABNORMAL LOW (ref 12.0–15.0)
MCH: 29.1 pg (ref 26.0–34.0)
MCHC: 31.3 g/dL (ref 30.0–36.0)
MCV: 93.1 fL (ref 78.0–100.0)
Platelets: 257 10*3/uL (ref 150–400)
RBC: 3.47 MIL/uL — ABNORMAL LOW (ref 3.87–5.11)
RDW: 15.9 % — AB (ref 11.5–15.5)
WBC: 20.5 10*3/uL — ABNORMAL HIGH (ref 4.0–10.5)

## 2013-12-03 LAB — BASIC METABOLIC PANEL
BUN: 5 mg/dL — AB (ref 6–23)
CO2: 23 mEq/L (ref 19–32)
CREATININE: 0.58 mg/dL (ref 0.50–1.10)
Calcium: 10.5 mg/dL (ref 8.4–10.5)
Chloride: 112 mEq/L (ref 96–112)
GFR calc non Af Amer: 89 mL/min — ABNORMAL LOW (ref >90)
Glucose, Bld: 141 mg/dL — ABNORMAL HIGH (ref 70–99)
POTASSIUM: 3.2 meq/L — AB (ref 3.7–5.3)
Sodium: 145 mEq/L (ref 137–147)

## 2013-12-03 MED ORDER — FAMOTIDINE 20 MG PO TABS
20.0000 mg | ORAL_TABLET | Freq: Two times a day (BID) | ORAL | Status: DC
  Administered 2013-12-03 – 2013-12-08 (×10): 20 mg via ORAL
  Filled 2013-12-03 (×12): qty 1

## 2013-12-03 NOTE — Progress Notes (Signed)
[facility] Progress Note Team 1 transfer   Heidi Hall OMV:[id number] DOB: 26-Dec-1940 DOA: 11/29/2013 PCP: No PCP Per Patient  Brief narrative: Heidi Hall is a 73 y.o. female presenting on 11/29/2013 with PMH of esophageal cancer diagnosed in 12/14, atrial flutter/atrial fibrillation, sacral decubitus ulcer, hx of perforated viscous after a PEG placement, C diff last month and deconditioning. The patient was transferred from [facility] to [place] where she has been for about 1 month. 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 A PEG tube was placed which resulted in perforation, acute peritonitis and vent dependent respiratory failure. She was found to have sputum positive for E. Coli and also developed C diff colitis which has been completely treated. She was referred [facility] and was planned to discharge from there on 12/11. However she was found to go into SVT and an ECHO was obtained which revealed a mass compressing the left atrium. She was sent to [facility] for further work up. Oncology and CT surgery was consulted.   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 - not a surgical candidate for resection or stenting per CT surgery and palliative care has been recommended by Dr Prescott Gum - Dr Marin Olp following, awaiting discussion with daughter - with her very poor functional status, intermittent arrhythmias, low BP, severe malnutrition, recent major complications/perforated viscus I think Palliative care if the most appropriate option   Atrial flutter - currently sinus rhythm, with bursts of RVR - cont Amiodarone, options limited due to low baseline BP - avoid full anticoagulation due to large esoph mass  Hypotension - AM cortisol within normal limits - stable now - holding B Blocker    Hx of  Clostridium difficile infection - treated and no current symptoms of diarrhea - repeat C diff negative - isolation to continue for a total of 30 days - stopped Protonix and started Pepcid  Leukocytosis - follow- no fever- diff reveals elevated Neutrophils - Cdiff negative - RN noted foul smelling discharge at PEG site- -stop ceftriaxone and monitor  STage 3 sacral decubitus ulcer - WOC eval    Hypercalcemia- mild- mod - corrected -maintain hydration- cont IVF until certain that tube feeds are being tolerated  Dysphagia due to 1 -continue TFs - cont clears for pleasure- med through PEG   Code Status: Full code Family Communication: called and d/w Dr.Ennever Disposition Plan: pending further stabilization  Consultants: Onc CT surgery  Procedures: none  Antibiotics: none  DVT prophylaxis: Heparin  Objective: Filed Weights   12/01/13 0500 12/02/13 0429 12/03/13 0500  Weight: 74.2 kg (163 lb 9.3 oz) 73.1 kg (161 lb 2.5 oz) 74.4 kg (164 lb 0.4 oz)   Blood pressure 94/58, pulse 81, temperature 98.6 F (37 C), temperature source Oral, resp. rate 19, height 5\' 2"  (1.575 m), weight 74.4 kg (164 lb 0.4 oz), SpO2 95.00%. No intake or output data in the 24 hours ending 12/03/13 1324   Exam: General: No acute respiratory distress- AAO x 3 Lungs: Clear to auscultation bilaterally without wheezes or crackles Cardiovascular: Regular rate and rhythm without murmur gallop or rub normal S1 and S2 Abdomen: Nontender, nondistended, soft, bowel sounds positive, no rebound, no ascites, no appreciable mass- PEG intact- dried blood and discharge on dressing around PEG- foul odor noted. No pus noted,  Extremities: No significant cyanosis, clubbing, or edema bilateral lower extremities  Data Reviewed: Basic Metabolic Panel:  Recent Labs Lab 11/28/13 0618 11/29/13 1005 11/30/13 0500 12/01/13 1025 12/03/13 0935  NA 150* 142 137 144 145  K 3.5* 3.6* 4.9 3.9 3.2*  CL 117* 109  107 113* 112  CO2 20 20 20 20 23   GLUCOSE 125* 114* 323* 91 141*  BUN 9 7 7 9  5*  CREATININE 0.57 0.43* 0.44* 0.59 0.58  CALCIUM 10.0 10.0 9.4 10.1 10.5  MG  --   --  2.0  --   --    Liver Function Tests:  Recent Labs Lab 11/30/13 0500  AST 14  ALT 14  ALKPHOS 55  BILITOT 0.2*  PROT 5.7*  ALBUMIN 1.7*   No results found for this basename: LIPASE, AMYLASE,  in the last 168 hours No results found for this basename: AMMONIA,  in the last 168 hours CBC:  Recent Labs Lab 11/30/13 0500 12/01/13 1025 12/02/13 0515 12/03/13 0935  WBC 15.1* 15.6* 16.2* 20.5*  NEUTROABS  --  10.9*  --   --   HGB 10.8* 10.2* 9.8* 10.1*  HCT 34.7* 32.8* 31.4* 32.3*  MCV 92.3 92.9 92.6 93.1  PLT 285 285 283 257   Cardiac Enzymes: No results found for this basename: CKTOTAL, CKMB, CKMBINDEX, TROPONINI,  in the last 168 hours BNP (last 3 results) No results found for this basename: PROBNP,  in the last 8760 hours CBG:  Recent Labs Lab 12/02/13 1705 12/02/13 2125 12/03/13 0007 12/03/13 0434 12/03/13 1152  GLUCAP 95 117* 109* 110* 111*    Recent Results (from the past 240 hour(s))  CLOSTRIDIUM DIFFICILE BY PCR     Status: None   Collection Time    11/28/13 11:43 AM      Result Value Ref Range Status   C difficile by pcr NEGATIVE  NEGATIVE Final  MRSA PCR SCREENING     Status: None   Collection Time    11/29/13  5:36 PM      Result Value Ref Range Status   MRSA by PCR NEGATIVE  NEGATIVE Final   Comment:            The GeneXpert MRSA Assay (FDA     approved for NASAL specimens     only), is one component of a     comprehensive MRSA colonization     surveillance program. It is not     intended to diagnose MRSA     infection nor to guide or     monitor treatment for     MRSA infections.  URINE CULTURE     Status: None   Collection Time    11/30/13  2:42 PM      Result Value Ref Range Status   Specimen Description URINE, RANDOM   Final   Special Requests NONE   Final    Culture  Setup Time     Final   Value: 11/30/2013 19:58     Performed at [place]     Final   Value: 70,000 COLONIES/ML     Performed at [facility]   Culture     Final   Value: Multiple bacterial morphotypes present, none predominant. Suggest appropriate recollection if clinically indicated.     Performed at [facility]   Report Status 12/01/2013 FINAL   Final     Studies:  Recent x-ray studies have been reviewed in detail by the Attending Physician  Scheduled Meds:  Scheduled Meds: . amiodarone  200 mg Oral BID  . famotidine  20 mg Oral BID  . feeding supplement (PRO-STAT SUGAR FREE 64)  30 mL Oral Daily  . folic acid  1 mg Oral Daily  . heparin  5,000 Units Subcutaneous 3 times per day  . magnesium oxide  400 mg Oral Daily  . magnesium sulfate 1 - 4 g bolus IVPB  2 g Intravenous Once  . midodrine  5 mg Oral TID WC  . multivitamin with minerals  1 tablet Oral Daily  . sodium chloride  10-40 mL Intracatheter Q12H   Continuous Infusions: . sodium chloride 10 mL (12/03/13 1149)  . feeding supplement (OSMOLITE 1.2 CAL) 1,000 mL (12/02/13 0525)    Time spent on care of this patient: >35 min   Domenic Polite, MD  [facility] Office  [phone] Pager - Text Page per Shea Evans as per below:  On-Call/Text Page:      [email or web address]      password [email or web address]  If 7PM-7AM, please contact night-coverage [email or web address] Password [email or web address] 12/03/2013, 1:24 PM   LOS: 4 days

## 2013-12-03 NOTE — Progress Notes (Signed)
Changed dressing on sacral. Wound has pink, yellow and white tissue. No drainage noted. Cleaned with ns and applied aquacel and foam dressing. Applied barrier cream around surrounding skin. Pt tolerated well.

## 2013-12-04 LAB — GLUCOSE, CAPILLARY
GLUCOSE-CAPILLARY: 108 mg/dL — AB (ref 70–99)
GLUCOSE-CAPILLARY: 133 mg/dL — AB (ref 70–99)
Glucose-Capillary: 135 mg/dL — ABNORMAL HIGH (ref 70–99)
Glucose-Capillary: 138 mg/dL — ABNORMAL HIGH (ref 70–99)
Glucose-Capillary: 147 mg/dL — ABNORMAL HIGH (ref 70–99)
Glucose-Capillary: 118 mg/dL — ABNORMAL HIGH (ref 70–99)

## 2013-12-04 MED ORDER — DIGOXIN 0.25 MG/ML IJ SOLN
0.1250 mg | Freq: Four times a day (QID) | INTRAMUSCULAR | Status: DC
  Administered 2013-12-05: 0.125 mg via INTRAVENOUS
  Filled 2013-12-04 (×3): qty 0.5

## 2013-12-04 MED ORDER — ZOLEDRONIC ACID 4 MG/5ML IV CONC
4.0000 mg | Freq: Once | INTRAVENOUS | Status: AC
  Administered 2013-12-04: 4 mg via INTRAVENOUS
  Filled 2013-12-04: qty 5

## 2013-12-04 MED ORDER — SODIUM CHLORIDE 0.45 % IV SOLN
INTRAVENOUS | Status: DC
  Administered 2013-12-04 – 2013-12-05 (×2): via INTRAVENOUS

## 2013-12-04 MED ORDER — DIGOXIN 0.25 MG/ML IJ SOLN
0.2500 mg | Freq: Once | INTRAMUSCULAR | Status: AC
  Administered 2013-12-04: 0.25 mg via INTRAVENOUS
  Filled 2013-12-04: qty 1

## 2013-12-04 NOTE — Progress Notes (Signed)
[facility] Progress Note Team 1 transfer   Dannelle Pudlo AGT:[id number] DOB: 09/20/1941 DOA: 11/29/2013 PCP: No PCP Per Patient  Brief narrative: Heidi Hall is a 73 y.o. female presenting on 11/29/2013 with PMH of esophageal cancer diagnosed in 12/14, atrial flutter/atrial fibrillation, sacral decubitus ulcer, hx of perforated viscous after a PEG placement, C diff last month and deconditioning. The patient was transferred from [facility] to [facility] where she has been for about 1 month. 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 A PEG tube was placed which resulted in perforation, acute peritonitis and vent dependent respiratory failure. She was found to have sputum positive for E. Coli and also developed C diff colitis which has been completely treated. She was referred to [facility] and was planned to discharge from there on 12/11. However she was found to go into SVT and an ECHO was obtained which revealed a mass compressing the left atrium. She was sent to [facility] for further work up. Oncology and CT surgery was consulted.   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 - not a surgical candidate for resection or stenting per CT surgery and palliative care has been recommended by Dr Donata Clay - Dr Myna Hidalgo following, recommends hospice care  - with her very poor functional status, intermittent arrhythmias, low BP, severe malnutrition, recent major complications/perforated viscus I think Palliative care if the most appropriate option - i called and discussed with daughter again -will consult Palliative medicine for goals of care   Atrial flutter - currently sinus rhythm, with bursts of RVR - cont Amiodarone, options limited due to low baseline BP - avoid full anticoagulation due to large esoph  mass  Hypotension - stable now - AM cortisol within normal limits - stable now - holding B Blocker    Hx of Clostridium difficile infection - treated and no current symptoms of diarrhea - repeat C diff negative - isolation to continue for a total of 30 days - stopped Protonix and started Pepcid  Leukocytosis - follow- no fever- diff reveals elevated Neutrophils - Cdiff negative - RN noted foul smelling discharge at PEG site- -stopped ceftriaxone and monitor  Severe protein calorie malnutrition -continue TFs  STage 3 sacral decubitus ulcer - WOC eval    Hypercalcemia- mild - corrected -maintain hydration- cont IVF until certain that tube feeds are being tolerated  Dysphagia due to 1 -continue TFs - cont clears for pleasure- med through PEG   Code Status: Full code Family Communication: called and d/w Dr.Ennever Disposition Plan: pending further stabilization  Consultants: Onc CT surgery  Procedures: none  Antibiotics: none  DVT prophylaxis: Heparin  Objective: Filed Weights   12/02/13 0429 12/03/13 0500 12/04/13 0350  Weight: 73.1 kg (161 lb 2.5 oz) 74.4 kg (164 lb 0.4 oz) 66.4 kg (146 lb 6.2 oz)   Blood pressure 95/56, pulse 87, temperature 97.3 F (36.3 C), temperature source Oral, resp. rate 16, height 5\' 2"  (1.575 m), weight 66.4 kg (146 lb 6.2 oz), SpO2 99.00%.  Intake/Output Summary (Last 24 hours) at 12/04/13 1429 Last data filed at 12/04/13 1240  Gross per 24 hour  Intake   1310 ml  Output    850 ml  Net    460 ml     Exam: General: No acute respiratory distress- AAO x 3 Lungs: Clear to auscultation bilaterally without wheezes or crackles Cardiovascular:  Regular rate and rhythm without murmur gallop or rub normal S1 and S2 Abdomen: Nontender, nondistended, soft, bowel sounds positive, no rebound, no ascites, no appreciable mass- PEG intact- dried blood and discharge on dressing around PEG- foul odor noted. No pus noted,  Extremities: No  significant cyanosis, clubbing, or edema bilateral lower extremities  Data Reviewed: Basic Metabolic Panel:  Recent Labs Lab 11/28/13 0618 11/29/13 1005 11/30/13 0500 12/01/13 1025 12/03/13 0935  NA 150* 142 137 144 145  K 3.5* 3.6* 4.9 3.9 3.2*  CL 117* 109 107 113* 112  CO2 20 20 20 20 23   GLUCOSE 125* 114* 323* 91 141*  BUN 9 7 7 9  5*  CREATININE 0.57 0.43* 0.44* 0.59 0.58  CALCIUM 10.0 10.0 9.4 10.1 10.5  MG  --   --  2.0  --   --    Liver Function Tests:  Recent Labs Lab 11/30/13 0500  AST 14  ALT 14  ALKPHOS 55  BILITOT 0.2*  PROT 5.7*  ALBUMIN 1.7*   No results found for this basename: LIPASE, AMYLASE,  in the last 168 hours No results found for this basename: AMMONIA,  in the last 168 hours CBC:  Recent Labs Lab 11/30/13 0500 12/01/13 1025 12/02/13 0515 12/03/13 0935  WBC 15.1* 15.6* 16.2* 20.5*  NEUTROABS  --  10.9*  --   --   HGB 10.8* 10.2* 9.8* 10.1*  HCT 34.7* 32.8* 31.4* 32.3*  MCV 92.3 92.9 92.6 93.1  PLT 285 285 283 257   Cardiac Enzymes: No results found for this basename: CKTOTAL, CKMB, CKMBINDEX, TROPONINI,  in the last 168 hours BNP (last 3 results) No results found for this basename: PROBNP,  in the last 8760 hours CBG:  Recent Labs Lab 12/03/13 2046 12/03/13 2350 12/04/13 0346 12/04/13 0800 12/04/13 1129  GLUCAP 122* 108* 133* 135* 138*    Recent Results (from the past 240 hour(s))  CLOSTRIDIUM DIFFICILE BY PCR     Status: None   Collection Time    11/28/13 11:43 AM      Result Value Ref Range Status   C difficile by pcr NEGATIVE  NEGATIVE Final  MRSA PCR SCREENING     Status: None   Collection Time    11/29/13  5:36 PM      Result Value Ref Range Status   MRSA by PCR NEGATIVE  NEGATIVE Final   Comment:            The GeneXpert MRSA Assay (FDA     approved for NASAL specimens     only), is one component of a     comprehensive MRSA colonization     surveillance program. It is not     intended to diagnose MRSA      infection nor to guide or     monitor treatment for     MRSA infections.  URINE CULTURE     Status: None   Collection Time    11/30/13  2:42 PM      Result Value Ref Range Status   Specimen Description URINE, RANDOM   Final   Special Requests NONE   Final   Culture  Setup Time     Final   Value: 11/30/2013 19:58     Performed at [place]     Final   Value: 70,000 COLONIES/ML     Performed at [facility]   Culture     Final   Value: Multiple  bacterial morphotypes present, none predominant. Suggest appropriate recollection if clinically indicated.     Performed at [facility]   Report Status 12/01/2013 FINAL   Final     Studies:  Recent x-ray studies have been reviewed in detail by the Attending Physician  Scheduled Meds:  Scheduled Meds: . amiodarone  200 mg Oral BID  . famotidine  20 mg Oral BID  . feeding supplement (PRO-STAT SUGAR FREE 64)  30 mL Oral Daily  . folic acid  1 mg Oral Daily  . heparin  5,000 Units Subcutaneous 3 times per day  . magnesium oxide  400 mg Oral Daily  . magnesium sulfate 1 - 4 g bolus IVPB  2 g Intravenous Once  . midodrine  5 mg Oral TID WC  . multivitamin with minerals  1 tablet Oral Daily  . sodium chloride  10-40 mL Intracatheter Q12H   Continuous Infusions: . sodium chloride 10 mL (12/03/13 1149)  . feeding supplement (OSMOLITE 1.2 CAL) 1,000 mL (12/04/13 1330)    Time spent on care of this patient: >35 min   Domenic Polite, MD  [facility] Office  [phone] Pager - Text Page per Shea Evans as per below:  On-Call/Text Page:      [email or web address]      password [email or web address]  If 7PM-7AM, please contact night-coverage [email or web address] Password [email or web address] 12/04/2013, 2:29 PM   LOS: 5 days

## 2013-12-04 NOTE — Consult Note (Signed)
WOC wound consult note Reason for Consult:Sacral Stage III PrU, periumbilical full thickness wound and interdigital spaces on left foot. Wound typePressure, infectious Pressure Ulcer POA: Yes Measurement:sacral: 2cm x 4cm x 0.4cm with moist, 90% red base.  Periumbilical wound:  Full thickness 1cm x 0.5cm x 0.2cm with moist, red base with periwound erythema.  Interdigital spaces on left foot:  Moist with skin flakes. Wound bed:As described above. Drainage (amount, consistency, odor) Scant on old dressing from sacrum, no purulence, no odor.  Light yellow on old dressing at umbilicus, no odor.  None from toes. Periwound:Intact, dry and as described above. Dressing procedure/placement/frequency:I have implemented a silver hydrofiber (Aquacel Ag+) to both the sacral Stage III ulcer and the periumbilical wounds with daily changes.  The interdigital space on the left foot will be addressed with hygiene.  Although her heels are intact, I have provided bilateral Prevalon Boots for pressure redistribution.  Patient is on a therapeutic sleep surface with low air loss feature and guidance for turning and repositioning is provided. [place] nursing team will not follow, but will remain available to this patient, the nursing and medical team.  Please re-consult if needed. Thanks, Maudie Flakes, MSN, RN, [place], [place], [place] [phone])

## 2013-12-04 NOTE — Progress Notes (Signed)
Ms. Sterne is out of the ICU. She seems comfortable. She is to feeds and doing. She still is very weak. Prealbumin is only 6.2.  She's not hurting. She's not bleeding. There is no shortness of breath.  Overall, performance status continues to be poor (ECoG 3).  I do not think that she really is out of bed.  At this point time, on is not sure what we would be accomplishing by treating her. We are not going to cure this. I am not sure she will be able to handle side effects. Even with low dose chemotherapy, with radiation I think this could have significant toxicity.  I think that we would need to consider hospice. I believe this be reasonable. I think this would help her quality of life. I will somehow need to get hold of her daughter her and talk to her.  Vital signs are good. She is afebrile.  Her cardiac exam is regular in rhythm with an occasional extra beat. Lungs show decreased at the bases. Abdomen is soft. A feeding tube is intact. Extremities shows some muscle atrophy bilaterally.  Her potassium is a little on the low side. Calcium is actually on the elevated side when corrected for her low albumin. We will try and some Zometa for this. This is actually a poor prognostic factor.  Again, I really think that we should begin to focus on comfort care and provide her with respect and dignity.  As always, we had an excellent prayer session. She has a strong faith. She knows that she will be "healed" just not down here on earth.  Lum Keas  2 Timothy 4:16-18

## 2013-12-04 NOTE — Progress Notes (Signed)
Palliative medicine consult received. Will schedule family meeting at earliest possible time we have an appointment time and provider available. For urgent issues call [phone].  Lane Hacker, DO Palliative Medicine

## 2013-12-05 LAB — GLUCOSE, CAPILLARY
Glucose-Capillary: 101 mg/dL — ABNORMAL HIGH (ref 70–99)
Glucose-Capillary: 106 mg/dL — ABNORMAL HIGH (ref 70–99)
Glucose-Capillary: 140 mg/dL — ABNORMAL HIGH (ref 70–99)
Glucose-Capillary: 95 mg/dL (ref 70–99)

## 2013-12-05 MED ORDER — DIGOXIN 125 MCG PO TABS
0.1250 mg | ORAL_TABLET | Freq: Every day | ORAL | Status: DC
  Administered 2013-12-05 – 2013-12-08 (×4): 0.125 mg via ORAL
  Filled 2013-12-05 (×4): qty 1

## 2013-12-05 MED ORDER — DIGOXIN 125 MCG PO TABS
0.1250 mg | ORAL_TABLET | Freq: Once | ORAL | Status: AC
  Administered 2013-12-05: 0.125 mg via ORAL
  Filled 2013-12-05: qty 1

## 2013-12-05 NOTE — Progress Notes (Addendum)
[facility] Progress Note Team 1 transfer   Marriah Madera HFW:[id number] DOB: [birth year] DOA: 11/29/2013 PCP: No PCP Per Patient  Brief narrative: Heidi Hall is a 73 y.o. female presenting on 11/29/2013 with PMH of esophageal cancer diagnosed in 12/14, atrial flutter/atrial fibrillation, sacral decubitus ulcer, hx of perforated viscous after a PEG placement, C diff last month and deconditioning. The patient was transferred from [facility] to [place] where she has been for about 1 month. 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 A PEG tube was placed which resulted in perforation, acute peritonitis and vent dependent respiratory failure. She was found to have sputum positive for E. Coli and also developed C diff colitis which has been completely treated. She was referred to [facility] and was planned to discharge from there on 12/11. However she was found to go into SVT and an ECHO was obtained which revealed a mass compressing the left atrium. She was sent to [facility] for further work up. Oncology and CT surgery was consulted.  She was seen by CVTS and Oncology and both are recommending Palliative care at this time. 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 - not a surgical candidate for resection or stenting per CT surgery and palliative care has been recommended by Dr Prescott Gum - Dr Marin Olp following, recommends hospice care  - with her very poor functional status, intermittent arrhythmias, low BP, severe malnutrition, recent major complications/perforated viscus I think Palliative care if the most appropriate option -i called and discussed with daughter several times -Palliative consulted for goals of care   Atrial flutter - currently sinus rhythm, with bursts of RVR - cont  Amiodarone, options limited due to low baseline BP - avoid full anticoagulation due to large esoph mass -digoxin added yetserday  Hypotension - stable now - AM cortisol within normal limits - stable now - holding B Blocker -continue midodrine    Hx of Clostridium difficile infection - treated and no current symptoms of diarrhea - repeat C diff negative - isolation to continue for a total of 30 days - stopped Protonix and started Pepcid  Leukocytosis - follow- no fever- diff reveals elevated Neutrophils - Cdiff negative - she was briefly on ceftriaxone for some discharge around PEG site  Severe protein calorie malnutrition -continue TFs  STage 3 sacral decubitus ulcer - WOC eval    Hypercalcemia- mild - corrected -maintain hydration- cont IVF until certain that tube feeds are being tolerated  Dysphagia due to 1 -continue TFs - cont clears for pleasure- med through PEG   Code Status: Full code Family Communication: called and d/w Dr.Ennever Disposition Plan: pending palliative eval  Consultants: Onc CT surgery  Procedures: none  Antibiotics: none  DVT prophylaxis: Heparin  Objective: Filed Weights   12/03/13 0500 12/04/13 0350 12/05/13 0528  Weight: 74.4 kg (164 lb 0.4 oz) 66.4 kg (146 lb 6.2 oz) 67.2 kg (148 lb 2.4 oz)   Blood pressure 104/70, pulse 94, temperature 98 F (36.7 C), temperature source Oral, resp. rate 18, height 5\' 2"  (1.575 m), weight 67.2 kg (148 lb 2.4 oz), SpO2 96.00%. No intake or output data in the 24 hours ending 12/05/13 1552   Exam: General: No acute respiratory distress- AAO x 3 Lungs: Clear to auscultation bilaterally without wheezes or crackles Cardiovascular: Regular rate and rhythm without murmur gallop or rub  normal S1 and S2 Abdomen: Nontender, nondistended, soft, bowel sounds positive, no rebound, no ascites, no appreciable mass- PEG intact- dried blood and discharge on dressing around PEG- foul odor noted. No pus  noted,  Extremities: No significant cyanosis, clubbing, or edema bilateral lower extremities  Data Reviewed: Basic Metabolic Panel:  Recent Labs Lab 11/29/13 1005 11/30/13 0500 12/01/13 1025 12/03/13 0935  NA 142 137 144 145  K 3.6* 4.9 3.9 3.2*  CL 109 107 113* 112  CO2 20 20 20 23   GLUCOSE 114* 323* 91 141*  BUN 7 7 9  5*  CREATININE 0.43* 0.44* 0.59 0.58  CALCIUM 10.0 9.4 10.1 10.5  MG  --  2.0  --   --    Liver Function Tests:  Recent Labs Lab 11/30/13 0500  AST 14  ALT 14  ALKPHOS 55  BILITOT 0.2*  PROT 5.7*  ALBUMIN 1.7*   No results found for this basename: LIPASE, AMYLASE,  in the last 168 hours No results found for this basename: AMMONIA,  in the last 168 hours CBC:  Recent Labs Lab 11/30/13 0500 12/01/13 1025 12/02/13 0515 12/03/13 0935  WBC 15.1* 15.6* 16.2* 20.5*  NEUTROABS  --  10.9*  --   --   HGB 10.8* 10.2* 9.8* 10.1*  HCT 34.7* 32.8* 31.4* 32.3*  MCV 92.3 92.9 92.6 93.1  PLT 285 285 283 257   Cardiac Enzymes: No results found for this basename: CKTOTAL, CKMB, CKMBINDEX, TROPONINI,  in the last 168 hours BNP (last 3 results) No results found for this basename: PROBNP,  in the last 8760 hours CBG:  Recent Labs Lab 12/04/13 1129 12/04/13 1643 12/04/13 2138 12/05/13 0006 12/05/13 0534  GLUCAP 138* 147* 118* 140* 95    Recent Results (from the past 240 hour(s))  CLOSTRIDIUM DIFFICILE BY PCR     Status: None   Collection Time    11/28/13 11:43 AM      Result Value Ref Range Status   C difficile by pcr NEGATIVE  NEGATIVE Final  MRSA PCR SCREENING     Status: None   Collection Time    11/29/13  5:36 PM      Result Value Ref Range Status   MRSA by PCR NEGATIVE  NEGATIVE Final   Comment:            The GeneXpert MRSA Assay (FDA     approved for NASAL specimens     only), is one component of a     comprehensive MRSA colonization     surveillance program. It is not     intended to diagnose MRSA     infection nor to guide or      monitor treatment for     MRSA infections.  URINE CULTURE     Status: None   Collection Time    11/30/13  2:42 PM      Result Value Ref Range Status   Specimen Description URINE, RANDOM   Final   Special Requests NONE   Final   Culture  Setup Time     Final   Value: 11/30/2013 19:58     Performed at [place]     Final   Value: 70,000 COLONIES/ML     Performed at [facility]   Culture     Final   Value: Multiple bacterial morphotypes present, none predominant. Suggest appropriate recollection if clinically indicated.     Performed at [facility]  Report Status 12/01/2013 FINAL   Final     Studies:  Recent x-ray studies have been reviewed in detail by the Attending Physician  Scheduled Meds:  Scheduled Meds: . amiodarone  200 mg Oral BID  . famotidine  20 mg Oral BID  . feeding supplement (PRO-STAT SUGAR FREE 64)  30 mL Oral Daily  . folic acid  1 mg Oral Daily  . heparin  5,000 Units Subcutaneous 3 times per day  . magnesium oxide  400 mg Oral Daily  . magnesium sulfate 1 - 4 g bolus IVPB  2 g Intravenous Once  . midodrine  5 mg Oral TID WC  . multivitamin with minerals  1 tablet Oral Daily  . sodium chloride  10-40 mL Intracatheter Q12H   Continuous Infusions: . sodium chloride 125 mL/hr at 12/05/13 1110  . feeding supplement (OSMOLITE 1.2 CAL) 1,000 mL (12/05/13 0200)    Time spent on care of this patient: >35 min   Domenic Polite, MD  [facility] Office  [phone] Pager - Text Page per Shea Evans as per below:  On-Call/Text Page:      [email or web address]      password [email or web address]  If 7PM-7AM, please contact night-coverage [email or web address] Password [email or web address] 12/05/2013, 3:52 PM   LOS: 6 days

## 2013-12-05 NOTE — Care Management Note (Signed)
    Page 1 of 1   12/08/2013     4:03:05 PM   CARE MANAGEMENT NOTE 12/08/2013  Patient:  Heidi Hall, Heidi Hall   Account Number:  [id number]  Date Initiated:  12/05/2013  Documentation initiated by:  Leni Pankonin  Subjective/Objective Assessment:   PT ADM ON 2/11 FROM [place] WITH ADVANCED SQUAMOUS CELL CA OF ESOPHAGUS. PT HAS SUPPORTIVE DAUGHTER.     Action/Plan:   AWAITING PALLIATIVE CARE CONSULT TO DETERMINE GOALS OF CARE.   Anticipated DC Date:  12/07/2013   Anticipated DC Plan:  SKILLED NURSING FACILITY  In-house referral  Clinical Social Worker      DC Planning Services  CM consult      Choice offered to / List presented to:             Status of service:  Completed, signed off Medicare Important Message given?   (If response is "NO", the following Medicare IM given date fields will be blank) Date Medicare IM given:   Date Additional Medicare IM given:    Discharge Disposition:  [place]  Per UR Regulation:  Reviewed for med. necessity/level of care/duration of stay  If discussed at [place] of Stay Meetings, dates discussed:   12/05/2013  12/07/2013    Comments:  12/07/13 Krishawn Vanderweele,RN,BSN [phone] [place]; PT/FAMILY REQUESTING PT BE MOVED TO NURSING FACILITY IN [place] WITH HOSPICE FOLLOW UP. CSW UPDATED WITH THESE EVENTS.  12/08/13 Katieann Hungate,RN,BSN [phone] PT DISCHARGING TO [facility] TODAY, PER CSW ARRANGEMENTS.  HOSPICE TO FOLLOW AT FACILITY.

## 2013-12-05 NOTE — Progress Notes (Signed)
Physical Therapy Treatment Patient Details Name: Sherill Newnam MRN: [id number] DOB: [birth year] Today's Date: 12/05/2013 Time: 5427-0623 PT Time Calculation (min): 15 min  PT Assessment / Plan / Recommendation  History of Present Illness  Tywanna Anstey is a 73 y.o. female with pmh of esophageal cancer, atrial flutter/atrial fibrillation, sacral decubitus ulcer, hx of perforated viscous and deconditioning; ended on [facility] for about 1 month. Patient is now transferred to [place] for further evaluation and treatment given findings of esophageal mass pressing on her heart and to receive further staging/treatment from oncology service. Patient denies any CP, SOB, fever, cough, hematemesis, melena or any other complaints currently. She has been hypercalcemic and with intermittent episodes of SVT. Currently calcium stable and rate is 69. Please referred to discharge summary from [facility] for further info/details on her history prior and during admission there.   PT Comments   Limited session today due to unable to encourage pt to move to EOB or OOB.  Session centered around rolling with holds in sidelying for pericare and AAROM exercise.   Follow Up Recommendations  SNF     Does the patient have the potential to tolerate intense rehabilitation     Barriers to Discharge        Equipment Recommendations  Other (comment) (TBA)    Recommendations for Other Services    Frequency     Progress towards PT Goals Progress towards PT goals: Not progressing toward goals - comment (not feeling well today, not wanting to get OOB)  Plan Current plan remains appropriate    Precautions / Restrictions Precautions Precautions: Fall   Pertinent Vitals/Pain     Mobility  Bed Mobility Overal bed mobility: Needs Assistance Bed Mobility: Rolling Rolling: Min assist General bed mobility comments: multiple rolls for toileting, pt deferred any further bed mobilitydue to not feeling well.    Exercises Other  Exercises Other Exercises: aarom to LE's during pericare   PT Diagnosis:    PT Problem List:   PT Treatment Interventions:     PT Goals (current goals can now be found in the care plan section) Acute Rehab PT Goals Patient Stated Goal: get back home; be able to move better PT Goal Formulation: With patient Time For Goal Achievement: 12/16/13 Potential to Achieve Goals: Fair  Visit Information  Last PT Received On: 12/05/13 Assistance Needed: +1 History of Present Illness:  Rayla Laszlo is a 73 y.o. female with pmh of esophageal cancer, atrial flutter/atrial fibrillation, sacral decubitus ulcer, hx of perforated viscous and deconditioning; ended on [facility] for about 1 month. Patient is now transferred to [place] for further evaluation and treatment given findings of esophageal mass pressing on her heart and to receive further staging/treatment from oncology service. Patient denies any CP, SOB, fever, cough, hematemesis, melena or any other complaints currently. She has been hypercalcemic and with intermittent episodes of SVT. Currently calcium stable and rate is 69. Please referred to discharge summary from [facility] for further info/details on her history prior and during admission there.    Subjective Data  Subjective: Nope.Marland KitchenMarland KitchenMarland KitchenI'm on the bed pan...you can help if you're not embarrassed Patient Stated Goal: get back home; be able to move better   Cognition  Cognition Arousal/Alertness: Awake/alert Behavior During Therapy: WFL for tasks assessed/performed    Balance     End of Session PT - End of Session Activity Tolerance: Patient tolerated treatment well;Other (comment) (limited by pt not feeling well, deferred most mobility) Patient left: in bed;with call bell/phone within reach  Nurse Communication: Mobility status   GP     Ambert Virrueta, Tessie Fass 12/05/2013, 3:58 PM

## 2013-12-05 NOTE — Progress Notes (Signed)
I spoke with Heidi Hall's daughter, [name], who plans to come tomorrow if weather allows. I gave her the palliative team phone number to confirm a meeting time when she is able to come. Really need to sit down with patient and daughter together to come up with a good plan for her care and elicit goals. Thank you for this consult and we will meet as soon as a time can be arranged with her family.  Vinie Sill, NP Palliative Medicine Team Pager # [phone] Team Phone # [phone]

## 2013-12-06 ENCOUNTER — Encounter: Payer: Self-pay | Admitting: *Deleted

## 2013-12-06 LAB — URINALYSIS, ROUTINE W REFLEX MICROSCOPIC
Bilirubin Urine: NEGATIVE
Glucose, UA: NEGATIVE mg/dL
Hgb urine dipstick: NEGATIVE
Ketones, ur: NEGATIVE mg/dL
NITRITE: NEGATIVE
Protein, ur: NEGATIVE mg/dL
SPECIFIC GRAVITY, URINE: 1.012 (ref 1.005–1.030)
UROBILINOGEN UA: 0.2 mg/dL (ref 0.0–1.0)
pH: 8 (ref 5.0–8.0)

## 2013-12-06 LAB — CBC
HEMATOCRIT: 34.1 % — AB (ref 36.0–46.0)
HEMOGLOBIN: 10.6 g/dL — AB (ref 12.0–15.0)
MCH: 29 pg (ref 26.0–34.0)
MCHC: 31.1 g/dL (ref 30.0–36.0)
MCV: 93.4 fL (ref 78.0–100.0)
Platelets: 287 10*3/uL (ref 150–400)
RBC: 3.65 MIL/uL — AB (ref 3.87–5.11)
RDW: 15.5 % (ref 11.5–15.5)
WBC: 20.3 10*3/uL — ABNORMAL HIGH (ref 4.0–10.5)

## 2013-12-06 LAB — BASIC METABOLIC PANEL
BUN: 11 mg/dL (ref 6–23)
CALCIUM: 12.5 mg/dL — AB (ref 8.4–10.5)
CO2: 27 meq/L (ref 19–32)
CREATININE: 0.47 mg/dL — AB (ref 0.50–1.10)
Chloride: 112 mEq/L (ref 96–112)
GFR calc Af Amer: >90 mL/min (ref >90)
GLUCOSE: 117 mg/dL — AB (ref 70–99)
Potassium: 2.8 mEq/L — CL (ref 3.7–5.3)
Sodium: 147 mEq/L (ref 137–147)

## 2013-12-06 LAB — URINE MICROSCOPIC-ADD ON

## 2013-12-06 LAB — GLUCOSE, CAPILLARY
GLUCOSE-CAPILLARY: 102 mg/dL — AB (ref 70–99)
GLUCOSE-CAPILLARY: 100 mg/dL — AB (ref 70–99)
GLUCOSE-CAPILLARY: 81 mg/dL (ref 70–99)
Glucose-Capillary: 100 mg/dL — ABNORMAL HIGH (ref 70–99)

## 2013-12-06 MED ORDER — ALTEPLASE 2 MG IJ SOLR
2.0000 mg | Freq: Once | INTRAMUSCULAR | Status: AC
  Administered 2013-12-06: 2 mg
  Filled 2013-12-06: qty 2

## 2013-12-06 MED ORDER — POTASSIUM CHLORIDE 20 MEQ/15ML (10%) PO LIQD
40.0000 meq | Freq: Two times a day (BID) | ORAL | Status: AC
  Administered 2013-12-06 – 2013-12-07 (×2): 40 meq via ORAL
  Filled 2013-12-06 (×2): qty 30

## 2013-12-06 NOTE — Progress Notes (Signed)
G-tube  Residual remains >400. Patient states fullness but denies abdominal pain. TF remains on hold at this time; Dr. Wendee Beavers informed via [place]. Patient incontinent of urine; bathed with gown and linens changed. Patient repositioned and oral care provided.Call bell near and bed alarm in place.Cindee Salt

## 2013-12-06 NOTE — Progress Notes (Signed)
Patient rested quietly through the night. No complaints of pain. Continues to use oral suctioning for oral secretions.

## 2013-12-06 NOTE — Progress Notes (Signed)
Received message that pt's daughter, Ardean Larsen wanted to talk to Dr. Marin Olp about her mother's condition.  Dr. Marin Olp will call daughter. Daughter made aware and is ok with this.

## 2013-12-06 NOTE — Progress Notes (Signed)
CSW continuing to follow for disposition once family and the palliative team meet.   Jeanette Caprice, MSW, [place]

## 2013-12-06 NOTE — Progress Notes (Signed)
[facility] PROGRESS NOTE  Jann Kazmierczak VEL:[id number] DOB: Aug 21, 1941 DOA: 11/29/2013 PCP: No PCP Per Patient  Brief narrative:  Heidi Hall is a 73 y.o. female presenting on 11/29/2013 with PMH of esophageal cancer diagnosed in 12/14, atrial flutter/atrial fibrillation, sacral decubitus ulcer, hx of perforated viscous after a PEG placement, C diff last month and deconditioning. The patient was transferred from [facility] to [place] where she has been for about 1 month. 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 A PEG tube was placed which resulted in perforation, acute peritonitis and vent dependent respiratory failure. She was found to have sputum positive for E. Coli and also developed C diff colitis which has been completely treated. She was referred to [facility] and was planned to discharge from there on 12/11. However she was found to go into SVT and an ECHO was obtained which revealed a mass compressing the left atrium. She was sent to [facility] for further work up. Oncology and CT surgery was consulted.  She was seen by CVTS and Oncology and both are recommending Palliative care at this time.  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  - not a surgical candidate for resection or stenting per CT surgery and palliative care has been recommended by Dr Prescott Gum  - Dr Marin Olp following, recommended hospice care  - with her very poor functional status, intermittent arrhythmias, low BP, severe malnutrition, recent major complications/perforated viscus I think Palliative care if the most appropriate option  -i called and discussed with daughter several times  -Palliative consulted for goals of care   Atrial flutter  - currently sinus rhythm, with bursts of RVR  - cont Amiodarone, options limited due to low baseline BP  -  avoid full anticoagulation due to large esoph mass  -digoxin added   Hypotension  - stable now  - AM cortisol within normal limits  - stable now  - holding B Blocker  -continue midodrine   Hx of Clostridium difficile infection  - treated and no current symptoms of diarrhea  - repeat C diff negative  - isolation to continue for a total of 30 days  - stopped Protonix and started Pepcid   Leukocytosis  - follow- no fever- diff reveals elevated Neutrophils  - Cdiff negative  - she was briefly on ceftriaxone for some discharge around PEG site  - Chest x ray reports no consolidation or infiltrate - recollect U/A, hold antibiotics at this point given no source of infection and patient is afebrile.  Severe protein calorie malnutrition  -continue TFs   STage 3 sacral decubitus ulcer  - WOC eval   Hypercalcemia- mild  - corrected  -On tube feeds currently  Dysphagia due to 1  -continue TFs  - cont clears for pleasure- med through PEG  Code Status: full Family Communication: None at bedside Disposition Plan: Pending palliative care meeting   Consultants:  Palliative care  Oncology  Cardiothoracic surgery  Procedures:  None  Antibiotics:  None  HPI/Subjective: Nursing has reported increased residual during tube feeds. Discussed discontinuing for an hour and then restarting at a slower rate with nursing. No new complaints otherwise.  Objective: Filed Vitals:   12/06/13 1101  BP:   Pulse: 82  Temp:   Resp:     Intake/Output Summary (Last 24 hours) at 12/06/13 1116 Last data filed at 12/06/13 0900  Gross per 24 hour  Intake    360 ml  Output      0 ml  Net    360 ml   Filed Weights   12/04/13 0350 12/05/13 0528 12/06/13 0419  Weight: 66.4 kg (146 lb 6.2 oz) 67.2 kg (148 lb 2.4 oz) 66.6 kg (146 lb 13.2 oz)    Exam:   General:  Pt in NAD, Alert and awake  Cardiovascular: normal s1 and s2, no rubs  Respiratory: no wheezes, no increased  WOB  Abdomen: soft, g tube in place, ND  Musculoskeletal: no cyanosis or clubbing   Data Reviewed: Basic Metabolic Panel:  Recent Labs Lab 11/30/13 0500 12/01/13 1025 12/03/13 0935 12/06/13 0700  NA 137 144 145 147  K 4.9 3.9 3.2* 2.8*  CL 107 113* 112 112  CO2 20 20 23 27   GLUCOSE 323* 91 141* 117*  BUN 7 9 5* 11  CREATININE 0.44* 0.59 0.58 0.47*  CALCIUM 9.4 10.1 10.5 12.5*  MG 2.0  --   --   --    Liver Function Tests:  Recent Labs Lab 11/30/13 0500  AST 14  ALT 14  ALKPHOS 55  BILITOT 0.2*  PROT 5.7*  ALBUMIN 1.7*   No results found for this basename: LIPASE, AMYLASE,  in the last 168 hours No results found for this basename: AMMONIA,  in the last 168 hours CBC:  Recent Labs Lab 11/30/13 0500 12/01/13 1025 12/02/13 0515 12/03/13 0935 12/06/13 0700  WBC 15.1* 15.6* 16.2* 20.5* 20.3*  NEUTROABS  --  10.9*  --   --   --   HGB 10.8* 10.2* 9.8* 10.1* 10.6*  HCT 34.7* 32.8* 31.4* 32.3* 34.1*  MCV 92.3 92.9 92.6 93.1 93.4  PLT 285 285 283 257 287   Cardiac Enzymes: No results found for this basename: CKTOTAL, CKMB, CKMBINDEX, TROPONINI,  in the last 168 hours BNP (last 3 results) No results found for this basename: PROBNP,  in the last 8760 hours CBG:  Recent Labs Lab 12/05/13 0534 12/05/13 1637 12/05/13 1959 12/06/13 0031 12/06/13 0421  GLUCAP 95 101* 106* 100* 102*    Recent Results (from the past 240 hour(s))  CLOSTRIDIUM DIFFICILE BY PCR     Status: None   Collection Time    11/28/13 11:43 AM      Result Value Ref Range Status   C difficile by pcr NEGATIVE  NEGATIVE Final  MRSA PCR SCREENING     Status: None   Collection Time    11/29/13  5:36 PM      Result Value Ref Range Status   MRSA by PCR NEGATIVE  NEGATIVE Final   Comment:            The GeneXpert MRSA Assay (FDA     approved for NASAL specimens     only), is one component of a     comprehensive MRSA colonization     surveillance program. It is not     intended to  diagnose MRSA     infection nor to guide or     monitor treatment for     MRSA infections.  URINE CULTURE     Status: None   Collection Time    11/30/13  2:42 PM      Result Value Ref Range Status   Specimen Description URINE, RANDOM   Final   Special Requests NONE   Final   Culture  Setup Time     Final   Value:  11/30/2013 19:58     Performed at [place]     Final   Value: 70,000 COLONIES/ML     Performed at [facility]   Culture     Final   Value: Multiple bacterial morphotypes present, none predominant. Suggest appropriate recollection if clinically indicated.     Performed at [facility]   Report Status 12/01/2013 FINAL   Final     Studies: No results found.  Scheduled Meds: . amiodarone  200 mg Oral BID  . digoxin  0.125 mg Oral Daily  . famotidine  20 mg Oral BID  . feeding supplement (PRO-STAT SUGAR FREE 64)  30 mL Oral Daily  . folic acid  1 mg Oral Daily  . heparin  5,000 Units Subcutaneous 3 times per day  . magnesium oxide  400 mg Oral Daily  . magnesium sulfate 1 - 4 g bolus IVPB  2 g Intravenous Once  . midodrine  5 mg Oral TID WC  . multivitamin with minerals  1 tablet Oral Daily  . potassium chloride  40 mEq Oral BID  . sodium chloride  10-40 mL Intracatheter Q12H   Continuous Infusions: . feeding supplement (OSMOLITE 1.2 CAL) 1,000 mL (12/05/13 1729)    Principal Problem:   Esophageal cancer Active Problems:   Atrial flutter   Hx of Clostridium difficile infection   Hypercalcemia   Perforated viscus   Decubitus ulcer of ankle, stage 3    Time spent: > 35 minutes    Velvet Bathe  [facility] Pager [phone] If 7PM-7AM, please contact night-coverage at [email or web address], password [facility] 12/06/2013, 11:16 AM  LOS: 7 days

## 2013-12-06 NOTE — Progress Notes (Signed)
Assessed per flow sheet. Denies chest pain or shortness of breath. G-tube dressing saturated with residual. >375 tube feed residual noted. Tube flushed/patent. G-tube dressing changed and feeding restarted per protocol; will re-evaluate per protocol. Naval ulcer dressing changed per order; no drainage or s/s of infection noted. No further needs at this time. Will continue to monitor.Cindee Salt

## 2013-12-06 NOTE — Progress Notes (Addendum)
Thank you for consulting the Palliative Medicine Team at [facility] to meet your patient's and family's needs.   The reason that you asked [place] to see your patient is for [place] and options.  We have scheduled your patient for a meeting:  2/18 1300  The Surrogate decision make is: Heidi Hall Contact information: [phone]  Your patient is able/unable to participate: likely  I spoke briefly with patient's daughter Heidi Hall, via telephone. She is unable to meet with [place] in person today due to weather. She is requesting to speak with Dr. Marin Olp for an update which I have left a message with his office. We discussed meeting via telephone if she is unable to come in tomorrow.   Vinie Sill, NP Palliative Medicine Team Pager # [phone] Team Phone # [phone]

## 2013-12-06 NOTE — Progress Notes (Signed)
Patient straight cath for UA/culture. Patient had previously voided. Patient cleaned with gown and linens changed. Oral care provided . Repositioned to right side. HOB elevated. VSS. Call bell near.Cindee Salt

## 2013-12-07 DIAGNOSIS — C159 Malignant neoplasm of esophagus, unspecified: Secondary | ICD-10-CM

## 2013-12-07 DIAGNOSIS — Z515 Encounter for palliative care: Secondary | ICD-10-CM

## 2013-12-07 LAB — URINE CULTURE
Colony Count: NO GROWTH
Culture: NO GROWTH

## 2013-12-07 LAB — BASIC METABOLIC PANEL
BUN: 12 mg/dL (ref 6–23)
CALCIUM: 13.5 mg/dL — AB (ref 8.4–10.5)
CO2: 28 mEq/L (ref 19–32)
Chloride: 116 mEq/L — ABNORMAL HIGH (ref 96–112)
Creatinine, Ser: 0.56 mg/dL (ref 0.50–1.10)
GFR calc Af Amer: >90 mL/min (ref >90)
GFR, EST NON AFRICAN AMERICAN: 90 mL/min — AB (ref >90)
GLUCOSE: 98 mg/dL (ref 70–99)
Potassium: 3.8 mEq/L (ref 3.7–5.3)
SODIUM: 153 meq/L — AB (ref 137–147)

## 2013-12-07 LAB — CBC
HCT: 35.1 % — ABNORMAL LOW (ref 36.0–46.0)
Hemoglobin: 10.8 g/dL — ABNORMAL LOW (ref 12.0–15.0)
MCH: 29 pg (ref 26.0–34.0)
MCHC: 30.8 g/dL (ref 30.0–36.0)
MCV: 94.4 fL (ref 78.0–100.0)
PLATELETS: 281 10*3/uL (ref 150–400)
RBC: 3.72 MIL/uL — ABNORMAL LOW (ref 3.87–5.11)
RDW: 15.9 % — ABNORMAL HIGH (ref 11.5–15.5)
WBC: 23.3 10*3/uL — AB (ref 4.0–10.5)

## 2013-12-07 LAB — GLUCOSE, CAPILLARY
GLUCOSE-CAPILLARY: 85 mg/dL (ref 70–99)
GLUCOSE-CAPILLARY: 86 mg/dL (ref 70–99)
GLUCOSE-CAPILLARY: 97 mg/dL (ref 70–99)
Glucose-Capillary: 88 mg/dL (ref 70–99)
Glucose-Capillary: 83 mg/dL (ref 70–99)
Glucose-Capillary: 92 mg/dL (ref 70–99)
Glucose-Capillary: 88 mg/dL (ref 70–99)

## 2013-12-07 MED ORDER — DEXTROSE 5 % IV SOLN
1.0000 g | INTRAVENOUS | Status: DC
  Administered 2013-12-07 – 2013-12-08 (×2): 1 g via INTRAVENOUS
  Filled 2013-12-07 (×2): qty 10

## 2013-12-07 NOTE — Progress Notes (Signed)
Clinical Social Work Department CLINICAL SOCIAL WORK PLACEMENT NOTE 12/07/2013  Patient:  Heidi Hall, Heidi Hall  Account Number:  [id number] Admit date:  11/30/2013  Clinical Social Worker:  Megan Salon  Date/time:  12/07/2013 04:24 PM  Clinical Social Work is seeking post-discharge placement for this patient at the following level of care:   [facility]   (*CSW will update this form in Epic as items are completed)   12/07/2013  Patient/family provided with [place] Department of Clinical Social Work's list of facilities offering this level of care within the geographic area requested by the patient (or if unable, by the patient's family).  12/07/2013  Patient/family informed of their freedom to choose among providers that offer the needed level of care, that participate in Medicare, Medicaid or managed care program needed by the patient, have an available bed and are willing to accept the patient.  12/07/2013  Patient/family informed of [facility]' ownership interest in [facility], as well as of the fact that they are under no obligation to receive care at this facility.  PASARR submitted to EDS on  PASARR number received from [facility] on   FL2 transmitted to all facilities in geographic area requested by pt/family on  12/07/2013 FL2 transmitted to all facilities within larger geographic area on   Patient informed that his/her managed care company has contracts with or will negotiate with  certain facilities, including the following:     Patient/family informed of bed offers received:   Patient chooses bed at  Physician recommends and patient chooses bed at    Patient to be transferred to  on   Patient to be transferred to facility by   The following physician request were entered in Epic:   Additional Comments:  Jeanette Caprice, MSW, [place] [facility]

## 2013-12-07 NOTE — Progress Notes (Signed)
CSW has left voicemail for [facility] and has faxed information over. CSW is awaiting phone call back.  Jeanette Caprice, MSW, [place]

## 2013-12-07 NOTE — Progress Notes (Signed)
ANTIBIOTIC CONSULT NOTE - INITIAL  Pharmacy Consult for Ceftriaxone  Indication: UTI  No Known Allergies  Patient Measurements: Height: 5\' 2"  (157.5 cm) Weight: 134 lb 11.2 oz (61.1 kg) IBW/kg (Calculated) : 50.1 Adjusted Body Weight: n/a  Vital Signs: Temp: 97.5 F (36.4 C) (02/19 0420) Temp src: Oral (02/19 0420) BP: 109/68 mmHg (02/19 1031) Pulse Rate: 89 (02/19 1031) Intake/Output from previous day: 02/18 0701 - 02/19 0700 In: 510 [P.O.:360] Out: 375 [Drains:375] Intake/Output from this shift:    Labs:  Recent Labs  12/06/13 0700 12/07/13 1154  WBC 20.3* 23.3*  HGB 10.6* 10.8*  PLT 287 281  CREATININE 0.47* 0.56   Estimated Creatinine Clearance: 53.1 ml/min (by C-G formula based on Cr of 0.56). No results found for this basename: VANCOTROUGH, Corlis Leak, VANCORANDOM, [place], GENTPEAK, GENTRANDOM, TOBRATROUGH, TOBRAPEAK, TOBRARND, AMIKACINPEAK, AMIKACINTROU, AMIKACIN,  in the last 72 hours   Microbiology: Recent Results (from the past 720 hour(s))  WOUND CULTURE     Status: None   Collection Time    11/18/13  1:12 PM      Result Value Ref Range Status   Specimen Description WOUND   Final   Special Requests PEG SITE   Final   Gram Stain     Final   Value: RARE WBC PRESENT, PREDOMINANTLY PMN     RARE SQUAMOUS EPITHELIAL CELLS PRESENT     MODERATE GRAM POSITIVE COCCI IN PAIRS     FEW GRAM NEGATIVE RODS     Performed at [facility]   Culture     Final   Value: MODERATE ESCHERICHIA COLI     Performed at [facility]   Report Status 11/21/2013 FINAL   Final   Organism ID, Bacteria ESCHERICHIA COLI   Final  CLOSTRIDIUM DIFFICILE BY PCR     Status: None   Collection Time    11/28/13 11:43 AM      Result Value Ref Range Status   C difficile by pcr NEGATIVE  NEGATIVE Final  MRSA PCR SCREENING     Status: None   Collection Time    11/29/13  5:36 PM      Result Value Ref Range Status   MRSA by PCR NEGATIVE  NEGATIVE Final   Comment:             The GeneXpert MRSA Assay (FDA     approved for NASAL specimens     only), is one component of a     comprehensive MRSA colonization     surveillance program. It is not     intended to diagnose MRSA     infection nor to guide or     monitor treatment for     MRSA infections.  URINE CULTURE     Status: None   Collection Time    11/30/13  2:42 PM      Result Value Ref Range Status   Specimen Description URINE, RANDOM   Final   Special Requests NONE   Final   Culture  Setup Time     Final   Value: 11/30/2013 19:58     Performed at [facility]     Final   Value: 70,000 COLONIES/ML     Performed at [facility]   Culture     Final   Value: Multiple bacterial morphotypes present, none predominant. Suggest appropriate recollection if clinically indicated.     Performed at [facility]   Report Status 12/01/2013  FINAL   Final  CULTURE, BLOOD (ROUTINE X 2)     Status: None   Collection Time    12/06/13 12:00 PM      Result Value Ref Range Status   Specimen Description BLOOD LEFT HAND   Final   Special Requests BOTTLES DRAWN AEROBIC ONLY 10CC   Final   Culture  Setup Time     Final   Value: 12/06/2013 16:31     Performed at [facility]   Culture     Final   Value:        BLOOD CULTURE RECEIVED NO GROWTH TO DATE CULTURE WILL BE HELD FOR 5 DAYS BEFORE ISSUING A FINAL NEGATIVE REPORT     Performed at [facility]   Report Status PENDING   Incomplete  CULTURE, BLOOD (ROUTINE X 2)     Status: None   Collection Time    12/06/13 12:10 PM      Result Value Ref Range Status   Specimen Description BLOOD RIGHT HAND   Final   Special Requests BOTTLES DRAWN AEROBIC ONLY 10CC   Final   Culture  Setup Time     Final   Value: 12/06/2013 16:31     Performed at [facility]   Culture     Final   Value:        BLOOD CULTURE RECEIVED NO GROWTH TO DATE CULTURE WILL BE HELD FOR 5 DAYS BEFORE ISSUING A FINAL NEGATIVE REPORT      Performed at [facility]   Report Status PENDING   Incomplete  URINE CULTURE     Status: None   Collection Time    12/06/13  5:35 PM      Result Value Ref Range Status   Specimen Description URINE, CLEAN CATCH   Final   Special Requests NONE   Final   Culture  Setup Time     Final   Value: 12/06/2013 19:00     Performed at [facility]     Final   Value: NO GROWTH     Performed at [facility]   Culture     Final   Value: NO GROWTH     Performed at [facility]   Report Status 12/07/2013 FINAL   Final    Medical History: History reviewed. No pertinent past medical history.  Medications:  Scheduled:  . amiodarone  200 mg Oral BID  . cefTRIAXone (ROCEPHIN)  IV  1 g Intravenous Q24H  . digoxin  0.125 mg Oral Daily  . famotidine  20 mg Oral BID  . feeding supplement (PRO-STAT SUGAR FREE 64)  30 mL Oral Daily  . folic acid  1 mg Oral Daily  . heparin  5,000 Units Subcutaneous 3 times per day  . magnesium oxide  400 mg Oral Daily  . magnesium sulfate 1 - 4 g bolus IVPB  2 g Intravenous Once  . midodrine  5 mg Oral TID WC  . multivitamin with minerals  1 tablet Oral Daily  . sodium chloride  10-40 mL Intracatheter Q12H   Assessment: 73 yo female admitted with multiple medical issues, pharmacy asked to begin empiric treatment with ceftriaxone for UTI.  Also recently on ceftriaxone for discharge around PEG site which grew Ecoli and leukocytosis.  Goal of Therapy:  Resolution of UTI  Plan:  1. Ceftriaxone 1g IV q 24 hrs. 2. Pharmacy will sign-off, please contact if further questions.  Thanks!  Uvaldo Rising, BCPS  Clinical Pharmacist Pager [phone]  12/07/2013 2:58 PM

## 2013-12-07 NOTE — Progress Notes (Signed)
Clinical Social Work Department BRIEF PSYCHOSOCIAL ASSESSMENT 12/07/2013  Patient:  Heidi Hall, Heidi Hall     Account Number:  [id number]     Admit date:  11/30/2013  Clinical Social Worker:  Megan Salon  Date/Time:  12/07/2013 04:15 PM  Referred by:  Care Management  Date Referred:  12/07/2013 Referred for  SNF Placement   Other Referral:   Interview type:  Family Other interview type:   CSW spoke to patient's daughter over the phone    PSYCHOSOCIAL DATA Living Status:  FACILITY Admitted from facility:  [facility] Level of care:  Long Term Acute Primary support name:  [name] Primary support relationship to patient:  CHILD, ADULT Degree of support available:   good    CURRENT CONCERNS Current Concerns  Post-Acute Placement   Other Concerns:    SOCIAL WORK ASSESSMENT / PLAN CSW received consult that patient needs SNF placement with [facility] following. CSW spoke to patient's daughter over the phone. CSW explained reason for calling and explained social worker role. Patient's daughter states that they are interested in [facility] in [place] because that is the closest to their home. CSW explained that she will follow up with them and see if patient can go their at dc. CSW states she will follow up with patient's daughter tomorrow for an update.   Assessment/plan status:  Psychosocial Support/Ongoing Assessment of Needs Other assessment/ plan:   Information/referral to community resources:   SNF information/CSW contact information    PATIENT'S/FAMILY'S RESPONSE TO PLAN OF CARE: Patient's daughter states that she would like [facility] [place] [facility] and would appreciate social worker to follow up tomorrow.       Jeanette Caprice, MSW, [place]

## 2013-12-07 NOTE — Progress Notes (Signed)
I spoke with Heidi Lennert, RN today regarding enteric precautions for Heidi Hall.  Patient was positive for C diff by pcr on 11/03/13.  Additional C diff pcr obtained on 11/28/13 resulted negative.  Test invalid because collected < 30 days from previous.  Patient is now eligible for another C diff pcr but must remain on precautions until collected and negative result.

## 2013-12-07 NOTE — Progress Notes (Signed)
Spoke with Hoyle Sauer, RN with infection control. Patient will need one more c-diff sample. If results are negative we may remove isolation but patient must be removed from room to re-clean d/t previous positive result in last 30 days.Cindee Salt

## 2013-12-07 NOTE — Progress Notes (Signed)
[facility] PROGRESS NOTE  Leighana Losurdo BJS:[id number] DOB: 08/21/1941 DOA: 11/29/2013 PCP: No PCP Per Patient  Brief narrative:  Heidi Hall is a 73 y.o. female presenting on 11/29/2013 with PMH of esophageal cancer diagnosed in 12/14, atrial flutter/atrial fibrillation, sacral decubitus ulcer, hx of perforated viscous after a PEG placement, C diff last month and deconditioning. The patient was transferred from [facility] to [place] where she has been for about 1 month. 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 A PEG tube was placed which resulted in perforation, acute peritonitis and vent dependent respiratory failure. She was found to have sputum positive for E. Coli and also developed C diff colitis which has been completely treated. She was referred to [facility] and was planned to discharge from there on 12/11. However she was found to go into SVT and an ECHO was obtained which revealed a mass compressing the left atrium. She was sent to [facility] for further work up. Oncology and CT surgery was consulted.  She was seen by CVTS and Oncology and both are recommending Palliative care at this time.  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 Prescott Gum  - Dr. Marin Olp following, recommended hospice care  - With her very poor functional status, intermittent arrhythmias, low BP, severe malnutrition, recent major complications/perforated viscus I think Palliative care if the most appropriate option  - Called and discussed with daughter several times  - Palliative consulted for goals of care: Per their discussion with family patient is currently DNR and would like to be discharged back to facility with hospice care in  place.  Also would like to look into radiation therapy as option moving forward.  Atrial flutter  - currently sinus rhythm, with bursts of RVR  - cont Amiodarone, options limited due to low baseline BP  - avoid full anticoagulation due to large esoph mass  - digoxin added   Hypotension  - resolved - AM cortisol on 12/01/13 within normal limits  - stable now  - holding B Blocker  - continue midodrine   Hx of Clostridium difficile infection  - treated and no current symptoms of diarrhea  - C diff negative 11/28/13 - isolation to continue for a total of 30 days  - stopped Protonix and started Pepcid on 12/03/13  Leukocytosis  - follow- no fever- diff reveals elevated Neutrophils  - Cdiff negative  - she was briefly on ceftriaxone for some discharge around PEG site  - Chest x ray reports no consolidation or infiltrate  - Start on Rocephin for suspected UTI (small leukocytes)  Severe protein calorie malnutrition  - Continue TFs  - Pt had high residuals-resolved now - Slowly restarting feeds  STage 3 sacral decubitus ulcer  - WOC eval   Hypercalcemia- mild  - On tube feeds- currently restarted  Dysphagia due to 1  - continue TFs  - cont clears for pt comfort - med through PEG  Hypokalemia - Recheck value - Orally replaced  Code Status: full (awaiting further discussion with daughter per palliative care) Family Communication: None at bedside Disposition Plan: Pending palliative care meeting with daughter   Consultants:  Palliative care  Oncology  Cardiothoracic surgery  WOC  Procedures:  None  Antibiotics:  Rocephin  HPI/Subjective:  Nursing has reported increased residual during tube feeds resolved. Patient reports generalized fatigue, but no new complaints.  Objective: Filed Vitals:   12/07/13 1031  BP: 109/68  Pulse: 89  Temp:   Resp:     Intake/Output Summary (Last 24 hours) at 12/07/13 1130 Last data filed at 12/07/13 0400  Gross per 24  hour  Intake    270 ml  Output      0 ml  Net    270 ml   Filed Weights   12/05/13 0528 12/06/13 0419 12/07/13 0420  Weight: 148 lb 2.4 oz (67.2 kg) 146 lb 13.2 oz (66.6 kg) 134 lb 11.2 oz (61.1 kg)    Exam:   General:  Pt in NAD, Alert and awake  Cardiovascular: normal s1 and s2, no rubs  Respiratory: no wheezes, no increased WOB  Abdomen: soft, g tube in place, ND  Musculoskeletal: no cyanosis or clubbing   Data Reviewed: Basic Metabolic Panel:  Recent Labs Lab 12/01/13 1025 12/03/13 0935 12/06/13 0700  NA 144 145 147  K 3.9 3.2* 2.8*  CL 113* 112 112  CO2 20 23 27   GLUCOSE 91 141* 117*  BUN 9 5* 11  CREATININE 0.59 0.58 0.47*  CALCIUM 10.1 10.5 12.5*   Liver Function Tests: No results found for this basename: AST, ALT, ALKPHOS, BILITOT, PROT, ALBUMIN,  in the last 168 hours No results found for this basename: LIPASE, AMYLASE,  in the last 168 hours No results found for this basename: AMMONIA,  in the last 168 hours CBC:  Recent Labs Lab 12/01/13 1025 12/02/13 0515 12/03/13 0935 12/06/13 0700  WBC 15.6* 16.2* 20.5* 20.3*  NEUTROABS 10.9*  --   --   --   HGB 10.2* 9.8* 10.1* 10.6*  HCT 32.8* 31.4* 32.3* 34.1*  MCV 92.9 92.6 93.1 93.4  PLT 285 283 257 287   Cardiac Enzymes: No results found for this basename: CKTOTAL, CKMB, CKMBINDEX, TROPONINI,  in the last 168 hours BNP (last 3 results) No results found for this basename: PROBNP,  in the last 8760 hours CBG:  Recent Labs Lab 12/06/13 1630 12/06/13 2043 12/06/13 2346 12/07/13 0411 12/07/13 1118  GLUCAP 81 88 85 97 86    Recent Results (from the past 240 hour(s))  CLOSTRIDIUM DIFFICILE BY PCR     Status: None   Collection Time    11/28/13 11:43 AM      Result Value Ref Range Status   C difficile by pcr NEGATIVE  NEGATIVE Final  MRSA PCR SCREENING     Status: None   Collection Time    11/29/13  5:36 PM      Result Value Ref Range Status   MRSA by PCR NEGATIVE  NEGATIVE Final    Comment:            The GeneXpert MRSA Assay (FDA     approved for NASAL specimens     only), is one component of a     comprehensive MRSA colonization     surveillance program. It is not     intended to diagnose MRSA     infection nor to guide or     monitor treatment for     MRSA infections.  URINE CULTURE     Status: None   Collection Time    11/30/13  2:42 PM      Result Value Ref Range Status   Specimen Description URINE, RANDOM   Final   Special Requests NONE  Final   Culture  Setup Time     Final   Value: 11/30/2013 19:58     Performed at [facility]     Final   Value: 70,000 COLONIES/ML     Performed at [facility]   Culture     Final   Value: Multiple bacterial morphotypes present, none predominant. Suggest appropriate recollection if clinically indicated.     Performed at [facility]   Report Status 12/01/2013 FINAL   Final  CULTURE, BLOOD (ROUTINE X 2)     Status: None   Collection Time    12/06/13 12:00 PM      Result Value Ref Range Status   Specimen Description BLOOD LEFT HAND   Final   Special Requests BOTTLES DRAWN AEROBIC ONLY 10CC   Final   Culture  Setup Time     Final   Value: 12/06/2013 16:31     Performed at [facility]   Culture     Final   Value:        BLOOD CULTURE RECEIVED NO GROWTH TO DATE CULTURE WILL BE HELD FOR 5 DAYS BEFORE ISSUING A FINAL NEGATIVE REPORT     Performed at [facility]   Report Status PENDING   Incomplete  CULTURE, BLOOD (ROUTINE X 2)     Status: None   Collection Time    12/06/13 12:10 PM      Result Value Ref Range Status   Specimen Description BLOOD RIGHT HAND   Final   Special Requests BOTTLES DRAWN AEROBIC ONLY 10CC   Final   Culture  Setup Time     Final   Value: 12/06/2013 16:31     Performed at [facility]   Culture     Final   Value:        BLOOD CULTURE RECEIVED NO GROWTH TO DATE CULTURE WILL BE HELD FOR 5 DAYS BEFORE ISSUING A FINAL NEGATIVE  REPORT     Performed at [facility]   Report Status PENDING   Incomplete     Studies: No results found.  Scheduled Meds: . amiodarone  200 mg Oral BID  . digoxin  0.125 mg Oral Daily  . famotidine  20 mg Oral BID  . feeding supplement (PRO-STAT SUGAR FREE 64)  30 mL Oral Daily  . folic acid  1 mg Oral Daily  . heparin  5,000 Units Subcutaneous 3 times per day  . magnesium oxide  400 mg Oral Daily  . magnesium sulfate 1 - 4 g bolus IVPB  2 g Intravenous Once  . midodrine  5 mg Oral TID WC  . multivitamin with minerals  1 tablet Oral Daily  . sodium chloride  10-40 mL Intracatheter Q12H   Continuous Infusions: . feeding supplement (OSMOLITE 1.2 CAL) Stopped (12/06/13 0845)    Principal Problem:   Esophageal cancer Active Problems:   Atrial flutter   Hx of Clostridium difficile infection   Hypercalcemia   Perforated viscus   Decubitus ulcer of ankle, stage 3    Time spent: > 35 minutes    Larwance Sachs  [facility] Pager [phone] If 7PM-7AM, please contact night-coverage at [email or web address], password [facility] 12/07/2013, 11:30 AM  LOS: 8 days

## 2013-12-07 NOTE — Progress Notes (Addendum)
@@@  Discharge HALP@@@  Physical Therapy Treatment Patient Details Name: Heidi Hall MRN: [id number] DOB: 18-Dec-1940 Today's Date: 12/07/2013 Time: 3532-9924 PT Time Calculation (min): 16 min  PT Assessment / Plan / Recommendation  History of Present Illness  Heidi Hall is a 73 y.o. female with pmh of esophageal cancer, atrial flutter/atrial fibrillation, sacral decubitus ulcer, hx of perforated viscous and deconditioning; ended on [facility] for about [facility] month. Patient is now transferred to [place] for further evaluation and treatment given findings of esophageal mass pressing on her heart and to receive further staging/treatment from oncology service. Patient denies any CP, SOB, fever, cough, hematemesis, melena or any other complaints currently. She has been hypercalcemic and with intermittent episodes of SVT. Currently calcium stable and rate is 69. Please referred to discharge summary from [facility] for further info/details on her history prior and during admission there.   PT Comments   Will sign off at this time due to pt unable to participate.  Hopefully palliative care can help pt with comfort care issues.    Follow Up Recommendations   (or home with 24 hour assist)     Does the patient have the potential to tolerate intense rehabilitation     Barriers to Discharge        Equipment Recommendations  Other (comment) (TBA by team)    Recommendations for Other Services    Frequency     Progress towards PT Goals Progress towards PT goals: Not progressing toward goals - comment (will d/c at this time due to no improvement expected)  Plan Current plan remains appropriate    Precautions / Restrictions Precautions Precautions: Fall   Pertinent Vitals/Pain     Mobility       Exercises Other Exercises Other Exercises: p/aarom to all 4's--very little patient assist   PT Diagnosis:    PT Problem List:   PT Treatment Interventions:     PT Goals (current goals can now be found in the  care plan section)    Visit Information  Last PT Received On: 12/07/13 Assistance Needed: +1 History of Present Illness:  Heidi Hall is a 73 y.o. female with pmh of esophageal cancer, atrial flutter/atrial fibrillation, sacral decubitus ulcer, hx of perforated viscous and deconditioning; ended on [facility] for about 1 month. Patient is now transferred to [place] for further evaluation and treatment given findings of esophageal mass pressing on her heart and to receive further staging/treatment from oncology service. Patient denies any CP, SOB, fever, cough, hematemesis, melena or any other complaints currently. She has been hypercalcemic and with intermittent episodes of SVT. Currently calcium stable and rate is 69. Please referred to discharge summary from [facility] hospital for further info/details on her history prior and during admission there.    Subjective Data  Subjective: No I don't want to move....stomach upset...   Cognition  Cognition Arousal/Alertness: Awake/alert Behavior During Therapy: WFL for tasks assessed/performed Overall Cognitive Status: No family/caregiver present to determine baseline cognitive functioning    Balance     End of Session PT - End of Session Activity Tolerance: Patient tolerated treatment well;Other (comment) Patient left: in bed;with call bell/phone within reach Nurse Communication: Need for lift equipment   GP     Domani Bakos, Tessie Fass 12/07/2013, 11:06 AM 12/07/2013  Donnella Sham, PT [phone]  (pager)

## 2013-12-07 NOTE — Consult Note (Signed)
Patient Heidi Hall      DOB: Feb 14, 1941      KDT:[id number]     Consult Note from the Palliative Medicine Team at [place] Requested by: Dr. Broadus John     PCP: No PCP Per Patient Reason for Consultation: [place] and options.    Phone [phone]  Assessment of patients Current state: Heidi Hall is a 73 yo female with esophageal cancer that was recently diagnosed 12/14 and hx of perforated viscous after PEG placement. She also had C. Diff and is severely deconditioned and weakened. She was admitted 11/29/13 from [facility] with SVT and an ECHO that showed a mass compressing her left atrium.   Her daughter, Heidi Hall [phone]), tells me that she has been told that Heidi Hall is not a surgical candidate and that she would not tolerate further chemotherapy at this point. Heidi Hall asks about radiation therapy as an option "to buy her some more time." We discussed that radiation would be risky and not curative and that even the act of transferring her back and forth to radiation would be difficult on Heidi Hall. She would still like to consider radiation as an option. Heidi Hall confirms that they wish for DNR status. Heidi Hall also says that they want to continue her tube feedings - we discussed that she is having difficulty tolerating feedings and that this may cause more harm to her if she continues to have high residuals and nausea - she understands. They all would like Heidi Hall to get closer to home/family as soon as possible to [facility] in [place], [place]. Heidi Hall says that Heidi Hall has Medicare and Medicaid so they are hopeful for hospice to follow her at [facility]. They want her to be comfortable but also want to do what they can to try and give her more time. I gave Heidi Hall Hard Choices book and I explained MOST form but we did not complete at this time. I will continue to follow and support Heidi Hall and her family.    Goals of Care: 1.  Code Status: DNR   2. Scope of  Treatment: Continue all treatment available (outside of DNR) at this time. Heidi Hall is considering other limitations to set in place.    4. Disposition: SNF and hopeful for hospice.   3. Symptom Management:    1. Pain: Oxycodone prn. 2. Bowel Regimen: Senna S prn. 3. Fever: Acetaminophen prn.  4. Nausea/Vomiting: Ondansetron prn.   4. Psychosocial: Emotional support provided to patient and family.   5. Spiritual: Spiritual care consulted.    Patient Documents Completed or Given: Document Given Completed  Advanced Directives Pkt    MOST yes   DNR    Gone from My [facility]ight    Hard Choices yes     Brief HPI: 73 yo debilitated female with esophageal cancer.   ROS: + nausea, denies pain    PMH: History reviewed. No pertinent past medical history.   PSH: Past Surgical History  Procedure Laterality Date  . Diagnostic laparoscopy     I have reviewed the FH and SH and  If appropriate update it with new information. No Known Allergies Scheduled Meds: . amiodarone  200 mg Oral BID  . digoxin  0.125 mg Oral Daily  . famotidine  20 mg Oral BID  . feeding supplement (PRO-STAT SUGAR FREE 64)  30 mL Oral Daily  . folic acid  1 mg Oral Daily  . heparin  5,000 Units Subcutaneous 3 times  per day  . magnesium oxide  400 mg Oral Daily  . magnesium sulfate 1 - 4 g bolus IVPB  2 g Intravenous Once  . midodrine  5 mg Oral TID WC  . multivitamin with minerals  1 tablet Oral Daily  . sodium chloride  10-40 mL Intracatheter Q12H   Continuous Infusions: . feeding supplement (OSMOLITE 1.2 CAL) Stopped (12/06/13 0845)   PRN Meds:.acetaminophen, acetaminophen, ipratropium-albuterol, levalbuterol, ondansetron (ZOFRAN) IV, ondansetron, ondansetron, oxyCODONE, oxyCODONE, sennosides-docusate sodium, sodium chloride    BP 109/68  Pulse 89  Temp(Src) 97.5 F (36.4 C) (Oral)  Resp 18  Ht 5\' 2"  (1.575 m)  Wt 61.1 kg (134 lb 11.2 oz)  BMI 24.63 kg/m2  SpO2 96%   PPS:  30%   Intake/Output Summary (Last 24 hours) at 12/07/13 1357 Last data filed at 12/07/13 0400  Gross per 24 hour  Intake    270 ml  Output      0 ml  Net    270 ml   LBM: 12/05/13                        Physical Exam:  General: NAD, pleasant, cooperative HEENT: [place]/AT, no JVD, dry mucous membranes Chest: CTA throughout, no labored breathes, symmetrical breathes CVS: RRR, S1 S2 Abdomen: Soft, ND, NT, PEG Ext: MAE, warm to touch, BLE trace edema Neuro: Alert, oriented to person/place, flat affect  Labs: CBC    Component Value Date/Time   WBC 23.3* 12/07/2013 1154   RBC 3.72* 12/07/2013 1154   HGB 10.8* 12/07/2013 1154   HCT 35.1* 12/07/2013 1154   PLT 281 12/07/2013 1154   MCV 94.4 12/07/2013 1154   MCH 29.0 12/07/2013 1154   MCHC 30.8 12/07/2013 1154   RDW 15.9* 12/07/2013 1154   LYMPHSABS 2.8 12/01/2013 1025   MONOABS 1.7* 12/01/2013 1025   EOSABS 0.3 12/01/2013 1025   BASOSABS 0.0 12/01/2013 1025    BMET    Component Value Date/Time   NA 153* 12/07/2013 1154   K 3.8 12/07/2013 1154   CL 116* 12/07/2013 1154   CO2 28 12/07/2013 1154   GLUCOSE 98 12/07/2013 1154   BUN 12 12/07/2013 1154   CREATININE 0.56 12/07/2013 1154   CALCIUM 13.5* 12/07/2013 1154   CALCIUM 13.4* 11/19/2013 1640   GFRNONAA 90* 12/07/2013 1154   GFRAA >90 12/07/2013 1154    CMP     Component Value Date/Time   NA 153* 12/07/2013 1154   K 3.8 12/07/2013 1154   CL 116* 12/07/2013 1154   CO2 28 12/07/2013 1154   GLUCOSE 98 12/07/2013 1154   BUN 12 12/07/2013 1154   CREATININE 0.56 12/07/2013 1154   CALCIUM 13.5* 12/07/2013 1154   CALCIUM 13.4* 11/19/2013 1640   PROT 5.7* 11/30/2013 0500   ALBUMIN 1.7* 11/30/2013 0500   AST 14 11/30/2013 0500   ALT 14 11/30/2013 0500   ALKPHOS 55 11/30/2013 0500   BILITOT 0.2* 11/30/2013 0500   GFRNONAA 90* 12/07/2013 1154   GFRAA >90 12/07/2013 1154     Time In Time Out Total Time Spent with Patient Total Overall Time  1300 1430 35min 32min    Greater than 50%  of this time was  spent counseling and coordinating care related to the above assessment and plan.  Vinie Sill, NP Palliative Medicine Team Pager # [phone] Team Phone # [phone]

## 2013-12-07 NOTE — Progress Notes (Signed)
CRITICAL VALUE ALERT  Critical value received:  Calcium 13.5  Date of notification:  3888  Time of notification:  2800  Critical value read back: n/a  Nurse who received alert:  V.Amyah Clawson, RN  MD notified (1st page):  Dr. Wendee Beavers via amion  Time of first page: 18  MD notified (2nd page):  Time of second page:  Responding MD:    Time MD responded:

## 2013-12-07 NOTE — Consult Note (Signed)
I have reviewed this case with our NP and agree with the Assessment and Plan as stated.  Aviona Martenson L. Westyn Driggers, MD MBA The Palliative Medicine Team at [place] Team Phone: [phone] Pager: [phone]   

## 2013-12-07 NOTE — Progress Notes (Signed)
Medications given. Tube feedings restarted @ 56ml/hr; will re-evaluate tolerance. Oral care provided. Abdominal dressing changed; no bleeding, oozing or redness noted to site. Will continue to monitor. Call bell near.Cindee Salt

## 2013-12-08 DIAGNOSIS — E870 Hyperosmolality and hypernatremia: Secondary | ICD-10-CM

## 2013-12-08 LAB — BASIC METABOLIC PANEL
BUN: 17 mg/dL (ref 6–23)
CALCIUM: 14.2 mg/dL — AB (ref 8.4–10.5)
CHLORIDE: 118 meq/L — AB (ref 96–112)
CO2: 27 meq/L (ref 19–32)
CREATININE: 0.64 mg/dL (ref 0.50–1.10)
GFR calc Af Amer: >90 mL/min (ref >90)
GFR calc non Af Amer: 86 mL/min — ABNORMAL LOW (ref >90)
Glucose, Bld: 140 mg/dL — ABNORMAL HIGH (ref 70–99)
Potassium: 3.1 mEq/L — ABNORMAL LOW (ref 3.7–5.3)
SODIUM: 155 meq/L — AB (ref 137–147)

## 2013-12-08 LAB — CBC
HCT: 36.1 % (ref 36.0–46.0)
Hemoglobin: 10.8 g/dL — ABNORMAL LOW (ref 12.0–15.0)
MCH: 28.4 pg (ref 26.0–34.0)
MCHC: 29.9 g/dL — ABNORMAL LOW (ref 30.0–36.0)
MCV: 95 fL (ref 78.0–100.0)
Platelets: 285 10*3/uL (ref 150–400)
RBC: 3.8 MIL/uL — AB (ref 3.87–5.11)
RDW: 16.2 % — AB (ref 11.5–15.5)
WBC: 23.9 10*3/uL — AB (ref 4.0–10.5)

## 2013-12-08 LAB — GLUCOSE, CAPILLARY
GLUCOSE-CAPILLARY: 101 mg/dL — AB (ref 70–99)
GLUCOSE-CAPILLARY: 119 mg/dL — AB (ref 70–99)
Glucose-Capillary: 110 mg/dL — ABNORMAL HIGH (ref 70–99)

## 2013-12-08 MED ORDER — DIGOXIN 125 MCG PO TABS: 0.1250 mg | ORAL_TABLET | Freq: Every day | ORAL | Status: AC

## 2013-12-08 MED ORDER — FREE WATER: 200.0000 mL | Freq: Three times a day (TID) | Status: AC

## 2013-12-08 MED ORDER — ALTEPLASE 2 MG IJ SOLR
2.0000 mg | Freq: Once | INTRAMUSCULAR | Status: AC
  Administered 2013-12-08: 2 mg
  Filled 2013-12-08: qty 2

## 2013-12-08 MED ORDER — FREE WATER
100.0000 mL | Freq: Three times a day (TID) | Status: DC
  Administered 2013-12-08: 100 mL

## 2013-12-08 MED ORDER — CEPHALEXIN 500 MG PO CAPS: 500.0000 mg | ORAL_CAPSULE | Freq: Two times a day (BID) | ORAL | Status: AC

## 2013-12-08 MED ORDER — FAMOTIDINE 20 MG PO TABS: 20.0000 mg | ORAL_TABLET | Freq: Two times a day (BID) | ORAL | Status: AC

## 2013-12-08 MED ORDER — HEPARIN SOD (PORK) LOCK FLUSH 100 UNIT/ML IV SOLN
250.0000 [IU] | INTRAVENOUS | Status: AC | PRN
  Administered 2013-12-08: 250 [IU]

## 2013-12-08 MED ORDER — LEVOFLOXACIN 500 MG PO TABS: 500.0000 mg | ORAL_TABLET | Freq: Every day | ORAL | Status: DC

## 2013-12-08 NOTE — Progress Notes (Signed)
Chaplain responded to spiritual care consult. Patient's RNs were finishing up a bath and medications. Patient said she was ready for a nap and had no emotional/spiritual needs at the time. Please page for additional support.   Ethelene Browns [phone]

## 2013-12-08 NOTE — Discharge Summary (Signed)
Physician Discharge Summary  Heidi Hall XF:[id number] DOB: 1941-07-18 DOA: 11/29/2013  PCP: No PCP Per Patient  Admit date: 11/29/2013 Discharge date: 12/08/2013  Time spent: > 35 minutes  Recommendations for Outpatient Follow-up:  1. Please continue to ensure comfort care measures 2. Can consider reassessing WBC levels within the next week: We'll plan on discharging with Keflex on board  Discharge Diagnoses:  Principal Problem:   Esophageal cancer Active Problems:   Atrial flutter   Hx of Clostridium difficile infection   Hypercalcemia   Perforated viscus   Decubitus ulcer of ankle, stage 3   Palliative care encounter   Hypernatremia   Discharge Condition: Stable  Diet recommendation: comfort feeds (was getting clear liquid diet in house)  [facility] Weights   12/06/13 0419 12/07/13 0420 12/08/13 0434  Weight: 66.6 kg (146 lb 13.2 oz) 61.1 kg (134 lb 11.2 oz) 62.2 kg (137 lb 2 oz)    History of present illness:  From original HPI: Heidi Hall is a 73 y.o. female presenting on 11/29/2013 with PMH of esophageal cancer diagnosed in 12/14, atrial flutter/atrial fibrillation, sacral decubitus ulcer, hx of perforated viscous after a PEG placement, C diff last month and deconditioning. The patient was transferred from [facility] to [place] where she has been for about 1 month. 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 A PEG tube was placed which resulted in perforation, acute peritonitis and vent dependent respiratory failure. She was found to have sputum positive for E. Coli and also developed C diff colitis which has been completely treated. She was referred to [facility] and was planned to discharge from there on 12/11. However she was found to go into SVT and an ECHO was obtained which revealed a mass compressing the left atrium. She was sent to [facility] for further work up. Oncology and CT surgery was  consulted.  She was seen by CVTS and Oncology and both are recommending Palliative care at this time.  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 Prescott Gum  - Dr. Marin Olp following, recommended hospice care  - With her very poor functional status, intermittent arrhythmias, low BP, severe malnutrition, recent major complications/perforated viscus, Palliative care if the most appropriate option  - Called and discussed with daughter several times  - Palliative consulted for goals of care: Per their discussion with family patient is currently DNR and would like to be discharged back to facility with hospice care in place.  Atrial flutter  - currently sinus rhythm  - cont Amiodarone, options limited due to low baseline BP  - avoid full anticoagulation due to large esoph mass  - digoxin added on 2/17   Hypotension  - AM cortisol on 12/01/13 within normal limits  - resolved but blood pressures soft. - holding B Blocker  - continue midodrine   Hx of Clostridium difficile infection  - treated and no current symptoms of diarrhea  - C diff negative 11/28/13  - Isolation to continue for a total of 30 days  - stopped Protonix and started Pepcid on 12/03/13   Leukocytosis  - C diff negative - unclear etiology as urine culture negative and blood culture negative to date. ? 2ary to neoplastic process - Will discharge with Keflex for the next  5 days although no source identified on discharge - Chest xray reported no consolidation or infiltrate.  Severe protein calorie malnutrition  - Continue TFs  - Pt had high residuals-resolved now  - Slowly restarting feeds   STage 3 sacral decubitus ulcer  - WOC eval   Hypercalcemia- mild  - On tube feeds- currently restarted  - Added Free water through PEG  on 2/20  - Recheck BMP   Dysphagia due to 1  - continue TFs  - cont clears for pt comfort  - med through PEG   Hypernatremia  - Will add free water through peg   Hypokalemia  - Resolved   Procedures:  None  Consultations:  Palliative care  Oncology  Discharge Exam: Filed Vitals:   12/08/13 1150  BP: 100/69  Pulse: 92  Temp: 97.5 F (36.4 C)  Resp: 19    General: Pt in NAD, Alert and awake Cardiovascular: irregularly irregular, no rubs Respiratory: CTA BL, no wheezes  Discharge Instructions  Discharge Orders   Future Orders Complete By Expires   Call MD for:  redness, tenderness, or signs of infection (pain, swelling, redness, odor or green/yellow discharge around incision site)  As directed    Call MD for:  severe uncontrolled pain  As directed    Call MD for:  temperature >100.4  As directed    Diet - low sodium heart healthy  As directed    Increase activity slowly  As directed        Medication List    STOP taking these medications       metoprolol 1 MG/ML injection  Commonly known as:  LOPRESSOR      TAKE these medications       acetaminophen 325 MG tablet  Commonly known as:  TYLENOL  Take 650 mg by mouth every 6 (six) hours as needed for moderate pain.     acidophilus Caps capsule  Take 1 capsule by mouth 2 (two) times daily.     amiodarone 200 MG tablet  Commonly known as:  PACERONE  Take 200 mg by mouth 2 (two) times daily.     dextrose 5 % solution  Inject 1,000 mLs into the vein every 12 (twelve) hours.     digoxin 0.125 MG tablet  Commonly known as:  LANOXIN  Take 1 tablet (0.125 mg total) by mouth daily.     enoxaparin 40 MG/0.4ML injection  Commonly known as:  LOVENOX  Inject 40 mg into the skin daily.     feeding supplement (PRO-STAT SUGAR FREE 64) Liqd  Take 30 mLs by mouth daily.     folic acid 1 MG tablet  Commonly known as:  FOLVITE  Take 1 mg by mouth daily.     free water Soln  Place 200 mLs into feeding  tube every 8 (eight) hours.     guaiFENesin 100 MG/5ML liquid  Commonly known as:  ROBITUSSIN  Take 200 mg by mouth 3 (three) times daily as needed for cough or congestion.     guaiFENesin-codeine 100-10 MG/5ML syrup  Take 10 mLs by mouth every 6 (six) hours as needed for cough.     ipratropium-albuterol 0.5-2.5 (3) MG/3ML Soln  Commonly known as:  DUONEB  Take 3 mLs by nebulization every 4 (four) hours as needed (for shortness of breath).     Keflex 500 mg po BID         magnesium oxide 400 MG tablet  Commonly known as:  MAG-OX  Take 400 mg by mouth daily.     midodrine 5 MG tablet  Commonly known as:  PROAMATINE  Take 5 mg by mouth 3 (three) times daily with meals.     multivitamins ther. w/minerals Tabs tablet  Take 1 tablet by mouth daily.     nitroGLYCERIN 0.4 MG/SPRAY spray  Commonly known as:  NITROLINGUAL  Place 1 spray under the tongue every 5 (five) minutes x 3 doses as needed for chest pain.     NUTRITIONAL SUPPLEMENT Liqd  Take 10 mL/hr by mouth daily.     ondansetron 4 MG tablet  Commonly known as:  ZOFRAN  Take 4 mg by mouth every 8 (eight) hours as needed for nausea or vomiting.     ondansetron 4 MG/5ML solution  Commonly known as:  ZOFRAN  Take 4 mg by mouth every 6 (six) hours as needed for nausea or vomiting.     oxycodone 5 MG capsule  Commonly known as:  OXY-IR  Take 5 mg by mouth every 4 (four) hours as needed (for moderate pain).              Pepcid         sennosides-docusate sodium 8.6-50 MG tablet  Commonly known as:  SENOKOT-S  Take 1 tablet by mouth 2 (two) times daily as needed for constipation.     sodium bicarbonate 650 MG tablet  Take 650 mg by mouth as needed (for clogged tube).     sodium polystyrene 15 GM/60ML suspension  Commonly known as:  KAYEXALATE  Take 30 g by mouth as needed (for K >5.5 meq/L).     thiamine 100 MG tablet  Commonly known as:  VITAMIN B-1  Take 100 mg by mouth daily.     vitamin C 500 MG  tablet  Commonly known as:  ASCORBIC ACID  Take 500 mg by mouth 2 (two) times daily with a meal.     zinc sulfate 220 MG capsule  Take 220 mg by mouth every evening.       No Known Allergies    The results of significant diagnostics from this hospitalization (including imaging, microbiology, ancillary and laboratory) are listed below for reference.    Significant Diagnostic Studies: Dg Abd 1 View  12/01/2013   CLINICAL DATA:  Pain evaluate for ileus. Abdominal pain and distention. Recent gastrojejunal catheter placement.  EXAM: PORTABLE ABDOMEN - 1 VIEW  COMPARISON:  CT ABD/PELVIS W CM dated 11/03/2013; CT ABD/PELVIS W CM dated 10/29/2013; DG ABD PORTABLE 1V dated 10/16/2013  FINDINGS: Bowel gas pattern unremarkable without evidence of obstruction or significant ileus. Expected stool burden in the colon. Gastrojejunal catheter with its tip in the expected location of the proximal jejunum, several cm of the on the ligament of Treitz. No visible opaque urinary tract calculi. Thoracolumbar scoliosis convex right and degenerative changes involving the visualized thoracic and lumbar spine. Moderate degenerative changes in the right hip.  IMPRESSION: No acute abdominal abnormality.   Electronically Signed   By: Evangeline Dakin M.D.   On: 12/01/2013 12:08   Ct Chest Wo Contrast  11/20/2013   CLINICAL DATA:  Staging for esophageal mass.  EXAM: CT CHEST WITHOUT CONTRAST  TECHNIQUE: Multidetector CT imaging of the chest was performed following the standard protocol without IV contrast.  COMPARISON:  DG ESOPHAGUS dated 11/16/2013; DG CHEST 1V PORT dated 11/15/2013; CT ABD/PELVIS W CM dated 11/03/2013; CT ABD/PELVIS W CM dated 10/29/2013  FINDINGS: There is some contrast material within the proximal  esophageal lumen which is mildly distended by the large distal esophageal mass. The mass itself is not well delineated by this noncontrast examination. The esophagus measures up to 10.5 x 6.4 cm transverse on image 33.  There is mass effect on the left atrium, although no clear cardiac involvement. There is mild splaying of the carina. The superior mediastinal fat planes are maintained. No discrete enlarged mediastinal or hilar lymph nodes are identified.  Right arm PICC extends to the mid SVC. There is mild diffuse atherosclerosis of the aorta, great vessels and coronary arteries. There are multiple calcifications throughout the left thyroid lobe associated with a central low density 1.4 cm lesion on image 6. The left lobe is diffusely enlarged.  There are moderate right and small left pleural effusions. The left pleural effusion extends into the superior aspect of the major fissure. No significant pericardial fluid is present.  There is scattered atelectasis in both lungs, primarily adjacent to the pleural effusions. A 3 mm perifissural nodule is noted in the right upper lobe on image 24. No other pulmonary nodules are identified.  The visualized upper abdomen has a stable appearance. Patient has a G-tube. There are no worrisome osseous findings.  IMPRESSION: 1. The large distal esophageal mass is not well characterized by this noncontrast study, although demonstrates no definite extra esophageal extension based on this or the prior abdominal CTs. There is extrinsic mass effect on the left atrium. 2. No distant metastases identified. 3. Persistent bilateral pleural effusions with scattered atelectasis in both lungs. A small right upper lobe perifissural nodule is nonspecific. 4. Mild diffuse atherosclerosis. 5. Asymmetric enlargement of the left thyroid lobe with calcifications and a low-density nodule. Consider further evaluation with thyroid ultrasound.   Electronically Signed   By: Camie Patience M.D.   On: 11/20/2013 07:52   Dg Esophagus  11/16/2013   CLINICAL DATA:  Dysphagia.  Esophageal obstruction.  EXAM: ESOPHOGRAM/BARIUM SWALLOW  TECHNIQUE: Single contrast examination was performed using water-soluble agent.   COMPARISON:  DG CHEST 1V PORT dated 11/15/2013  FLUOROSCOPY TIME:  0 min 54 seconds.  FINDINGS: A limited examination was performed due to patient condition. Patient was placed in the prone LPO position and drank Omnipaque 300. The proximal esophagus is markedly dilated with contrast shouldering a large mass in the mid esophagus. Minimal passage of contrast into the mid/ lower esophagus. There is no contrast seen passing into the stomach.  IMPRESSION: Large obstructing esophageal mass, as partially imaged on 11/03/2013.   Electronically Signed   By: Lorin Picket M.D.   On: 11/16/2013 15:47   Ir Chancy Milroy Francesco Runner Convert Gastr-jej Per W/fl Mod Sed  11/24/2013   CLINICAL DATA:  High gastric residuals. Needs gastrostomy tube converted to a gastrojejunal feeding tube.  EXAM: CONVERSION OF GASTROSTOMY TUBE TO A GASTROJEJUNAL FEEDING TUBE  Physician: Stephan Minister. Henn, MD  MEDICATIONS: None  ANESTHESIA/SEDATION: Moderate sedation time: None  FLUOROSCOPY TIME:  15 min and 6 seconds  PROCEDURE: Informed consent was obtained for this procedure. The existing gastrostomy and surrounding skin were prepped and draped in sterile fashion. Maximal barrier sterile technique was utilized including caps, mask, sterile gowns, sterile gloves, sterile drape, hand hygiene and skin antiseptic. The gastrostomy tube balloon was deflated and the gastrostomy tube was removed. A 5 French catheter was advanced into the stomach. A C2 catheter was used to cannulate the duodenum bulb. No significant contrast or air was draining into the small bowel. Eventually, a Glidewire was advanced into the descending duodenum  and distal duodenum. Placement of the 24 [place] GJ tube was very difficult because the wire repeatedly coiled in the stomach and came out of the small bowel. Eventually, the tube was advanced into the proximal jejunum. The retention balloon was inflated with 8 mL of saline. Contrast injection confirmed placement in the small bowel.   COMPLICATIONS: None  FINDINGS: Catheter tip in the proximal jejunum.  IMPRESSION: Successful conversion of the gastrostomy tube to a gastrojejunal feeding tube.   Electronically Signed   By: Markus Daft M.D.   On: 11/24/2013 18:00   Ir Replc Gastro/colonic Tube Percut W/fluoro  11/17/2013   CLINICAL DATA:  Indwelling gastrostomy tube has become dislodged.  EXAM: PERCUTANEOUS GASTROSTOMY TUBE REPLACEMENT  MEDICATIONS: None  ANESTHESIA/SEDATION: None  CONTRAST:  20mL OMNIPAQUE IOHEXOL 300 MG/ML  SOLN  FLUOROSCOPY TIME:  6 seconds.  PROCEDURE: A 24 French balloon retention gastrostomy tube was advanced through the pre-existing percutaneous tract. The retention balloon was inflated with saline and the tube injected with contrast to confirm position.  COMPLICATIONS: None.  FINDINGS: Injection of the new catheter confirms intraluminal positioning within the stomach.  IMPRESSION: Percutaneous gastrostomy tube replacement with new 24 French balloon retention catheter.   Electronically Signed   By: Aletta Edouard M.D.   On: 11/17/2013 16:16   Dg Chest Port 1 View  12/03/2013   CLINICAL DATA:  PICC line placement.  EXAM: PORTABLE CHEST - 1 VIEW  COMPARISON:  DG CHEST 1V PORT dated 11/23/2013; CT CHEST W/O CM dated 11/20/2013; CT ABD/PELVIS W CM dated 11/03/2013  FINDINGS: Right upper extremity PICC line terminates in the SVC. Lung shows stable interstitial edema and right pleural effusion. Bilateral lower lobe atelectasis present. The heart size is stable.  IMPRESSION: PICC line tip in SVC.   Electronically Signed   By: Aletta Edouard M.D.   On: 12/03/2013 13:33   Dg Chest Port 1 View  11/23/2013   CLINICAL DATA:  Pneumonia.  Shortness of breath.  EXAM: PORTABLE CHEST - 1 VIEW  COMPARISON:  CT CHEST W/O CM dated 11/20/2013  FINDINGS: Central line in stable position. The mediastinum is normal. Cardiomegaly with progressive bilateral pulmonary interstitial prominence. These findings are consistent with congestive heart  failure and pulmonary interstitial edema. Developing alveolar edema cannot be excluded. Right pleural effusion cannot be excluded. No pneumothorax. Retrocardiac density is consistent with distended esophagus, reference is made to prior CT report . No acute osseous abnormality. Degenerative changes both shoulders.  IMPRESSION: 1. Findings consistent with congestive heart failure with progressive pulmonary interstitial edema and probable right pleural effusion.  2. Prominent retrocardiac density consistent with distended esophagus, reference made to chest CT report 11/20/2013.   Electronically Signed   By: Marcello Moores  Register   On: 11/23/2013 07:54   Dg Chest Port 1 View  11/15/2013   CLINICAL DATA:  CHF  EXAM: PORTABLE CHEST - 1 VIEW  COMPARISON:  Portable exam 0550 hr compared to 11/13/2013  FINDINGS: Right arm PICC line tip projects over proximal to mid SVC.  Enlargement of cardiac silhouette.  Mild perihilar and basilar infiltrates likely pulmonary edema.  Small right pleural effusion is suspected.  No segmental consolidation or pneumothorax.  IMPRESSION: Probable pulmonary edema and small right pleural effusion.   Electronically Signed   By: Lavonia Dana M.D.   On: 11/15/2013 08:15   Dg Chest Port 1 View  11/13/2013   CLINICAL DATA:  PICC line placement.  EXAM: PORTABLE CHEST - 1 VIEW  COMPARISON:  Film earlier  today at 1000 hr  FINDINGS: Right arm PICC line is been placed with the tip at the level of the cavoatrial junction. Lungs again demonstrate bilateral airspace disease and edema. The heart size is stable.  IMPRESSION: PICC line tip is at the cavoatrial junction.   Electronically Signed   By: Aletta Edouard M.D.   On: 11/13/2013 18:38   Dg Chest Port 1 View  11/13/2013   CLINICAL DATA:  SOB  EXAM: PORTABLE CHEST - 1 VIEW  COMPARISON:  DG CHEST 1V PORT dated 11/09/2013; DG CHEST 1V PORT dated 11/02/2013; DG CHEST 1V PORT dated 11/01/2013  FINDINGS: There is bilateral interstitial and alveolar airspace  opacities. There are bilateral trace pleural effusions. There is stable enlargement of the heart size. There is no pneumothorax.  There is loss of the normal right acromion humeral distance as can be seen with a chronic rotator cuff tear. There are osteoarthritic changes of the right glenohumeral joint.  IMPRESSION: Bilateral interstitial and alveolar airspace opacities concerning for pulmonary edema versus multilobar pneumonia.   Electronically Signed   By: Kathreen Devoid   On: 11/13/2013 10:16   Dg Chest Port 1 View  11/09/2013   CLINICAL DATA:  Respiratory failure.  EXAM: PORTABLE CHEST - 1 VIEW  COMPARISON:  11/02/2013, 11/01/2013, and 10/25/2013  FINDINGS: Heart size and pulmonary vascularity are normal. There are no infiltrates or effusions. The markings are slightly accentuated due to a shallow inspiration. No acute osseous abnormality. Severe degenerative changes of both shoulders.  IMPRESSION: No acute abnormalities.   Electronically Signed   By: Rozetta Nunnery M.D.   On: 11/09/2013 18:53    Microbiology: Recent Results (from the past 240 hour(s))  MRSA PCR SCREENING     Status: None   Collection Time    11/29/13  5:36 PM      Result Value Ref Range Status   MRSA by PCR NEGATIVE  NEGATIVE Final   Comment:            The GeneXpert MRSA Assay (FDA     approved for NASAL specimens     only), is one component of a     comprehensive MRSA colonization     surveillance program. It is not     intended to diagnose MRSA     infection nor to guide or     monitor treatment for     MRSA infections.  URINE CULTURE     Status: None   Collection Time    11/30/13  2:42 PM      Result Value Ref Range Status   Specimen Description URINE, RANDOM   Final   Special Requests NONE   Final   Culture  Setup Time     Final   Value: 11/30/2013 19:58     Performed at [place]     Final   Value: 70,000 COLONIES/ML     Performed at [facility]   Culture     Final    Value: Multiple bacterial morphotypes present, none predominant. Suggest appropriate recollection if clinically indicated.     Performed at [facility]   Report Status 12/01/2013 FINAL   Final  CULTURE, BLOOD (ROUTINE X 2)     Status: None   Collection Time    12/06/13 12:00 PM      Result Value Ref Range Status   Specimen Description BLOOD LEFT HAND   Final   Special Requests BOTTLES DRAWN AEROBIC  ONLY 10CC   Final   Culture  Setup Time     Final   Value: 12/06/2013 16:31     Performed at [facility]   Culture     Final   Value:        BLOOD CULTURE RECEIVED NO GROWTH TO DATE CULTURE WILL BE HELD FOR 5 DAYS BEFORE ISSUING A FINAL NEGATIVE REPORT     Performed at [facility]   Report Status PENDING   Incomplete  CULTURE, BLOOD (ROUTINE X 2)     Status: None   Collection Time    12/06/13 12:10 PM      Result Value Ref Range Status   Specimen Description BLOOD RIGHT HAND   Final   Special Requests BOTTLES DRAWN AEROBIC ONLY 10CC   Final   Culture  Setup Time     Final   Value: 12/06/2013 16:31     Performed at [facility]   Culture     Final   Value:        BLOOD CULTURE RECEIVED NO GROWTH TO DATE CULTURE WILL BE HELD FOR 5 DAYS BEFORE ISSUING A FINAL NEGATIVE REPORT     Performed at [facility]   Report Status PENDING   Incomplete  URINE CULTURE     Status: None   Collection Time    12/06/13  5:35 PM      Result Value Ref Range Status   Specimen Description URINE, CLEAN CATCH   Final   Special Requests NONE   Final   Culture  Setup Time     Final   Value: 12/06/2013 19:00     Performed at [place]     Final   Value: NO GROWTH     Performed at [facility]   Culture     Final   Value: NO GROWTH     Performed at [facility]   Report Status 12/07/2013 FINAL   Final     Labs: Basic Metabolic Panel:  Recent Labs Lab 12/03/13 0935 12/06/13 0700 12/07/13 1154  NA 145 147 153*  K  3.2* 2.8* 3.8  CL 112 112 116*  CO2 23 27 28   GLUCOSE 141* 117* 98  BUN 5* 11 12  CREATININE 0.58 0.47* 0.56  CALCIUM 10.5 12.5* 13.5*   Liver Function Tests: No results found for this basename: AST, ALT, ALKPHOS, BILITOT, PROT, ALBUMIN,  in the last 168 hours No results found for this basename: LIPASE, AMYLASE,  in the last 168 hours No results found for this basename: AMMONIA,  in the last 168 hours CBC:  Recent Labs Lab 12/02/13 0515 12/03/13 0935 12/06/13 0700 12/07/13 1154  WBC 16.2* 20.5* 20.3* 23.3*  HGB 9.8* 10.1* 10.6* 10.8*  HCT 31.4* 32.3* 34.1* 35.1*  MCV 92.6 93.1 93.4 94.4  PLT 283 257 287 281   Cardiac Enzymes: No results found for this basename: CKTOTAL, CKMB, CKMBINDEX, TROPONINI,  in the last 168 hours BNP: BNP (last 3 results) No results found for this basename: PROBNP,  in the last 8760 hours CBG:  Recent Labs Lab 12/07/13 1608 12/07/13 2217 12/08/13 0034 12/08/13 0437 12/08/13 0822  GLUCAP 92 88 101* 119* 110*       Signed:  Velvet Bathe  [facility] 12/08/2013, 1:07 PM

## 2013-12-08 NOTE — Progress Notes (Signed)
Report called to Seth Bake at [facility] in [place], [place].

## 2013-12-08 NOTE — Progress Notes (Signed)
Clinical Social Work Department CLINICAL SOCIAL WORK PLACEMENT NOTE 12/08/2013  Patient:  VICIE, CECH  Account Number:  [id number] Admit date:  11/30/2013  Clinical Social Worker:  Megan Salon  Date/time:  12/07/2013 04:24 PM  Clinical Social Work is seeking post-discharge placement for this patient at the following level of care:   [place]   (*CSW will update this form in Epic as items are completed)   12/07/2013  Patient/family provided with [place] Department of Clinical Social Work's list of facilities offering this level of care within the geographic area requested by the patient (or if unable, by the patient's family).  12/07/2013  Patient/family informed of their freedom to choose among providers that offer the needed level of care, that participate in Medicare, Medicaid or managed care program needed by the patient, have an available bed and are willing to accept the patient.  12/07/2013  Patient/family informed of [facility]' ownership interest in [facility], as well as of the fact that they are under no obligation to receive care at this facility.  PASARR submitted to EDS on  PASARR number received from [place] on   FL2 transmitted to all facilities in geographic area requested by pt/family on  12/07/2013 FL2 transmitted to all facilities within larger geographic area on   Patient informed that his/her managed care company has contracts with or will negotiate with  certain facilities, including the following:     Patient/family informed of bed offers received:  12/08/2013 Patient chooses bed at [facility], [place] Physician recommends and patient chooses bed at    Patient to be transferred to [facility] on  12/08/2013 Patient to be transferred to facility by EMS  The following physician request were entered in Epic:   Additional Comments:  Jeanette Caprice, MSW, [place]

## 2013-12-08 NOTE — Progress Notes (Signed)
Progress Note from the Palliative Medicine Team at [facility]: Ms. Schneiderman is alert today but continues to have flat affect. We are continuing to attempt to advance her tube feeding if she is able to tolerate. She is planned to d/c to SNF today with hospice to follow due to esophageal cancer and mass compressing her left atrium. Ms. Marina denies any pain and denies any nausea at this time. She does continue to have nausea at times. She says she is happy to be going back closer to her family. I have been unable to reach [place] today but emotional support was provided to Ms. Strebel.    Objective: No Known Allergies Scheduled Meds: . amiodarone  200 mg Oral BID  . cefTRIAXone (ROCEPHIN)  IV  1 g Intravenous Q24H  . digoxin  0.125 mg Oral Daily  . famotidine  20 mg Oral BID  . feeding supplement (PRO-STAT SUGAR FREE 64)  30 mL Oral Daily  . folic acid  1 mg Oral Daily  . heparin  5,000 Units Subcutaneous 3 times per day  . magnesium oxide  400 mg Oral Daily  . magnesium sulfate 1 - 4 g bolus IVPB  2 g Intravenous Once  . midodrine  5 mg Oral TID WC  . multivitamin with minerals  1 tablet Oral Daily  . sodium chloride  10-40 mL Intracatheter Q12H   Continuous Infusions: . feeding supplement (OSMOLITE 1.2 CAL) 1,000 mL (12/07/13 1215)   PRN Meds:.acetaminophen, acetaminophen, ipratropium-albuterol, levalbuterol, ondansetron (ZOFRAN) IV, ondansetron, ondansetron, oxyCODONE, oxyCODONE, sennosides-docusate sodium, sodium chloride  BP 96/64  Pulse 92  Temp(Src) 97.8 F (36.6 C) (Oral)  Resp 18  Ht 5\' 2"  (1.575 m)  Wt 62.2 kg (137 lb 2 oz)  BMI 25.07 kg/m2  SpO2 95%   PPS: 30%  Pain Score: none    Intake/Output Summary (Last 24 hours) at 12/08/13 0948 Last data filed at 12/08/13 0600  Gross per 24 hour  Intake  496.5 ml  Output      0 ml  Net  496.5 ml      LBM: 12/07/13      Physical Exam:  General: NAD, cooperative, chronically ill appearing HEENT: [place]/AT, dry  mucous membranes, no JVD, poor dentation Chest: CTA throughout, no tachypnea/labored breathing CVS: RRR, S1 S2 Abdomen: Soft, NT, ND, PEG Ext: MAE, left foot cool to touch, DP pulses 2+ bilaterally Neuro: Alert, oriented to person/place/somewhat situation, flat affect  Labs: CBC    Component Value Date/Time   WBC 23.3* 12/07/2013 1154   RBC 3.72* 12/07/2013 1154   HGB 10.8* 12/07/2013 1154   HCT 35.1* 12/07/2013 1154   PLT 281 12/07/2013 1154   MCV 94.4 12/07/2013 1154   MCH 29.0 12/07/2013 1154   MCHC 30.8 12/07/2013 1154   RDW 15.9* 12/07/2013 1154   LYMPHSABS 2.8 12/01/2013 1025   MONOABS 1.7* 12/01/2013 1025   EOSABS 0.3 12/01/2013 1025   BASOSABS 0.0 12/01/2013 1025    BMET    Component Value Date/Time   NA 153* 12/07/2013 1154   K 3.8 12/07/2013 1154   CL 116* 12/07/2013 1154   CO2 28 12/07/2013 1154   GLUCOSE 98 12/07/2013 1154   BUN 12 12/07/2013 1154   CREATININE 0.56 12/07/2013 1154   CALCIUM 13.5* 12/07/2013 1154   CALCIUM 13.4* 11/19/2013 1640   GFRNONAA 90* 12/07/2013 1154   GFRAA >90 12/07/2013 1154    CMP     Component Value Date/Time   NA 153* 12/07/2013  1154   K 3.8 12/07/2013 1154   CL 116* 12/07/2013 1154   CO2 28 12/07/2013 1154   GLUCOSE 98 12/07/2013 1154   BUN 12 12/07/2013 1154   CREATININE 0.56 12/07/2013 1154   CALCIUM 13.5* 12/07/2013 1154   CALCIUM 13.4* 11/19/2013 1640   PROT 5.7* 11/30/2013 0500   ALBUMIN 1.7* 11/30/2013 0500   AST 14 11/30/2013 0500   ALT 14 11/30/2013 0500   ALKPHOS 55 11/30/2013 0500   BILITOT 0.2* 11/30/2013 0500   GFRNONAA 90* 12/07/2013 1154   GFRAA >90 12/07/2013 1154     Assessment and Plan: 1. Code Status: DNR 2. Symptom Control: 1. Nausea/Vomiting: Ondansetron prn. 2. Pain: Oxycodone prn. 3. Bowel Regimen: Senna S prn. 4. Fever: Acetaminophen prn. 3. Psycho/Social: Attempted to reach daughter, [name], numerous times throughout day unsuccessfully. 4. Spiritual: Spiritual care consult.  5. Disposition: SNF with hospice.    Patient Documents Completed or Given: Document Given Completed  Advanced Directives Pkt    MOST yes   DNR  yes  Gone from My [facility]    Hard Choices yes     Time In Time Out Total Time Spent with Patient Total Overall Time  0930 1005 31min 66min    Greater than 50%  of this time was spent counseling and coordinating care related to the above assessment and plan.  Vinie Sill, NP Palliative Medicine Team Pager # [phone] Team Phone # [phone]

## 2013-12-08 NOTE — Progress Notes (Signed)
Pt picked up by [facility] at 1800.

## 2013-12-08 NOTE — Progress Notes (Signed)
[facility] called Education officer, museum back and was not able to make a bed offer. CSW spoke to patient's daughter about choosing other facilities. Daughter wanted to go with facilities in [place], [place]. [facility] was the only bed offer. Patient's daughter agreeable.   Clinical Social Worker facilitated patient discharge by contacting the family and facility, [facility] in [address], [place]. Patient's daughter agreeable to this plan and arranging transport via EMS . CSW will sign off, as social work intervention is no longer needed.  Jeanette Caprice, MSW, [place]

## 2013-12-08 NOTE — Progress Notes (Signed)
[facility] PROGRESS NOTE  Aneisha Hunger SEG:[id number] DOB: 1941-04-20 DOA: 11/29/2013 PCP: No PCP Per Patient  Brief narrative:  Heidi Hall is a 73 y.o. female presenting on 11/29/2013 with PMH of esophageal cancer diagnosed in 12/14, atrial flutter/atrial fibrillation, sacral decubitus ulcer, hx of perforated viscous after a PEG placement, C diff last month and deconditioning. The patient was transferred from [facility] to [place] where she has been for about 1 month. 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 A PEG tube was placed which resulted in perforation, acute peritonitis and vent dependent respiratory failure. She was found to have sputum positive for E. Coli and also developed C diff colitis which has been completely treated. She was referred to [facility] and was planned to discharge from there on 12/11. However she was found to go into SVT and an ECHO was obtained which revealed a mass compressing the left atrium. She was sent to [facility] for further work up. Oncology and CT surgery was consulted.  She was seen by CVTS and Oncology and both are recommending Palliative care at this time.  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 Prescott Gum  - Dr. Marin Olp following, recommended hospice care  - With her very poor functional status, intermittent arrhythmias, low BP, severe malnutrition, recent major complications/perforated viscus, Palliative care if the most appropriate option  - Called and discussed with daughter several times  - Palliative consulted for goals of care: Per their discussion with family patient is currently DNR and would like to be discharged back to facility with hospice care in place.   Awaiting placement  Atrial flutter  - currently sinus rhythm - cont Amiodarone, options limited due to low baseline BP  - avoid full anticoagulation due to large esoph mass  - digoxin added on 2/17  Hypotension  - AM cortisol on 12/01/13 within normal limits  - stable now  - holding B Blocker  - continue midodrine   Hx of Clostridium difficile infection  - treated and no current symptoms of diarrhea  - C diff negative 11/28/13 - Isolation to continue for a total of 30 days  - stopped Protonix and started Pepcid on 12/03/13  Leukocytosis  - follow- no fever- diff reveals elevated Neutrophils  - Cdiff negative  - she was briefly on ceftriaxone for some discharge around PEG site  - Chest x ray reports no consolidation or infiltrate  - Start on Rocephin for suspected UTI (small leukocytes) - Check CBC  Severe protein calorie malnutrition  - Continue TFs  - Pt had high residuals-resolved now - Slowly restarting feeds   STage 3 sacral decubitus ulcer  - WOC eval   Hypercalcemia- mild  - On tube feeds- currently restarted - Added Free water through PEG on 2/20 - Recheck BMP  Dysphagia due to 1  - continue TFs  - cont clears for pt comfort - med through PEG  Hypernatremia - Will add free water through peg  Hypokalemia - Resolved  Code Status: DNR Family Communication: None at bedside.  Disposition Plan: Awaiting placement at SNF with hospice care   Consultants:  Palliative care  Oncology  Cardiothoracic surgery  WOC  Procedures:  None  Antibiotics:  Rocephin- started 2/19  HPI/Subjective: Patient denies  any new complaints. Pt reports feeling better today.  Objective: Filed Vitals:   12/08/13 0434  BP: 96/64  Pulse: 92  Temp: 97.8 F (36.6 C)  Resp: 18    Intake/Output Summary (Last 24 hours) at 12/08/13 1058 Last data filed at 12/08/13 0600  Gross per 24 hour  Intake  496.5 ml  Output      0 ml  Net  496.5 ml   Filed Weights    12/06/13 0419 12/07/13 0420 12/08/13 0434  Weight: 66.6 kg (146 lb 13.2 oz) 61.1 kg (134 lb 11.2 oz) 62.2 kg (137 lb 2 oz)    Exam:   General:  Pt in NAD, Alert and awake  Cardiovascular: normal s1 and s2, no rubs  Respiratory: no wheezes, no increased WOB  Abdomen: soft, g tube in place, ND  Musculoskeletal: no cyanosis or clubbing   Data Reviewed: Basic Metabolic Panel:  Recent Labs Lab 12/03/13 0935 12/06/13 0700 12/07/13 1154  NA 145 147 153*  K 3.2* 2.8* 3.8  CL 112 112 116*  CO2 23 27 28   GLUCOSE 141* 117* 98  BUN 5* 11 12  CREATININE 0.58 0.47* 0.56  CALCIUM 10.5 12.5* 13.5*   Liver Function Tests: No results found for this basename: AST, ALT, ALKPHOS, BILITOT, PROT, ALBUMIN,  in the last 168 hours No results found for this basename: LIPASE, AMYLASE,  in the last 168 hours No results found for this basename: AMMONIA,  in the last 168 hours CBC:  Recent Labs Lab 12/02/13 0515 12/03/13 0935 12/06/13 0700 12/07/13 1154  WBC 16.2* 20.5* 20.3* 23.3*  HGB 9.8* 10.1* 10.6* 10.8*  HCT 31.4* 32.3* 34.1* 35.1*  MCV 92.6 93.1 93.4 94.4  PLT 283 257 287 281   Cardiac Enzymes: No results found for this basename: CKTOTAL, CKMB, CKMBINDEX, TROPONINI,  in the last 168 hours BNP (last 3 results) No results found for this basename: PROBNP,  in the last 8760 hours CBG:  Recent Labs Lab 12/07/13 1608 12/07/13 2217 12/08/13 0034 12/08/13 0437 12/08/13 0822  GLUCAP 92 88 101* 119* 110*    Recent Results (from the past 240 hour(s))  CLOSTRIDIUM DIFFICILE BY PCR     Status: None   Collection Time    11/28/13 11:43 AM      Result Value Ref Range Status   C difficile by pcr NEGATIVE  NEGATIVE Final  MRSA PCR SCREENING     Status: None   Collection Time    11/29/13  5:36 PM      Result Value Ref Range Status   MRSA by PCR NEGATIVE  NEGATIVE Final   Comment:            The GeneXpert MRSA Assay (FDA     approved for NASAL specimens     only), is one  component of a     comprehensive MRSA colonization     surveillance program. It is not     intended to diagnose MRSA     infection nor to guide or     monitor treatment for     MRSA infections.  URINE CULTURE     Status: None   Collection Time    11/30/13  2:42 PM      Result Value Ref Range Status   Specimen Description URINE, RANDOM   Final   Special Requests NONE   Final   Culture  Setup Time     Final   Value: 11/30/2013 19:58     Performed  at [place]     Final   Value: 70,000 COLONIES/ML     Performed at [facility]   Culture     Final   Value: Multiple bacterial morphotypes present, none predominant. Suggest appropriate recollection if clinically indicated.     Performed at [facility]   Report Status 12/01/2013 FINAL   Final  CULTURE, BLOOD (ROUTINE X 2)     Status: None   Collection Time    12/06/13 12:00 PM      Result Value Ref Range Status   Specimen Description BLOOD LEFT HAND   Final   Special Requests BOTTLES DRAWN AEROBIC ONLY 10CC   Final   Culture  Setup Time     Final   Value: 12/06/2013 16:31     Performed at [facility]   Culture     Final   Value:        BLOOD CULTURE RECEIVED NO GROWTH TO DATE CULTURE WILL BE HELD FOR 5 DAYS BEFORE ISSUING A FINAL NEGATIVE REPORT     Performed at [facility]   Report Status PENDING   Incomplete  CULTURE, BLOOD (ROUTINE X 2)     Status: None   Collection Time    12/06/13 12:10 PM      Result Value Ref Range Status   Specimen Description BLOOD RIGHT HAND   Final   Special Requests BOTTLES DRAWN AEROBIC ONLY 10CC   Final   Culture  Setup Time     Final   Value: 12/06/2013 16:31     Performed at [facility]   Culture     Final   Value:        BLOOD CULTURE RECEIVED NO GROWTH TO DATE CULTURE WILL BE HELD FOR 5 DAYS BEFORE ISSUING A FINAL NEGATIVE REPORT     Performed at [facility]   Report Status PENDING   Incomplete  URINE CULTURE      Status: None   Collection Time    12/06/13  5:35 PM      Result Value Ref Range Status   Specimen Description URINE, CLEAN CATCH   Final   Special Requests NONE   Final   Culture  Setup Time     Final   Value: 12/06/2013 19:00     Performed at [facility]     Final   Value: NO GROWTH     Performed at [facility]   Culture     Final   Value: NO GROWTH     Performed at [facility]   Report Status 12/07/2013 FINAL   Final     Studies: No results found.  Scheduled Meds: . amiodarone  200 mg Oral BID  . cefTRIAXone (ROCEPHIN)  IV  1 g Intravenous Q24H  . digoxin  0.125 mg Oral Daily  . famotidine  20 mg Oral BID  . feeding supplement (PRO-STAT SUGAR FREE 64)  30 mL Oral Daily  . folic acid  1 mg Oral Daily  . free water  100 mL Per Tube 3 times per day  . heparin  5,000 Units Subcutaneous 3 times per day  . magnesium oxide  400 mg Oral Daily  . magnesium sulfate 1 - 4 g bolus IVPB  2 g Intravenous Once  . midodrine  5 mg Oral TID WC  . multivitamin with minerals  1 tablet Oral Daily  .  sodium chloride  10-40 mL Intracatheter Q12H   Continuous Infusions: . feeding supplement (OSMOLITE 1.2 CAL) 1,000 mL (12/07/13 1215)    Principal Problem:   Esophageal cancer Active Problems:   Atrial flutter   Hx of Clostridium difficile infection   Hypercalcemia   Perforated viscus   Decubitus ulcer of ankle, stage 3   Palliative care encounter    Time spent: > 35 minutes    Larwance Sachs  [facility] Pager [phone] If 7PM-7AM, please contact night-coverage at [email or web address], password [facility] 12/08/2013, 10:58 AM  LOS: 9 days

## 2013-12-12 LAB — CULTURE, BLOOD (ROUTINE X 2)
CULTURE: NO GROWTH
Culture: NO GROWTH

## 2013-12-17 DEATH — deceased

## 2014-04-09 IMAGING — CT CT CHEST W/O CM
2 of 3 series · 15 of 36 positions shown, 18 images · non-contrast
Comparison: DG ESOPHAGUS dated 11/16/2013;

CLINICAL DATA: Staging for esophageal mass.

EXAM:
CT CHEST WITHOUT CONTRAST
TECHNIQUE: Multidetector CT imaging of the chest was performed following the
standard protocol without IV contrast.

[Series 2: thorax 5.0 i31f 1 · axial · 0.71mm/px · z∈[-280,-36]mm · 12 of 59 slices shown, 15 images]
[im 5/59  mediastinal]
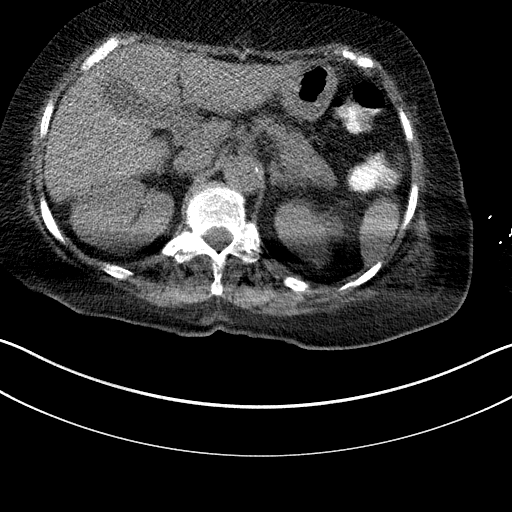
[im 5/59  lung]
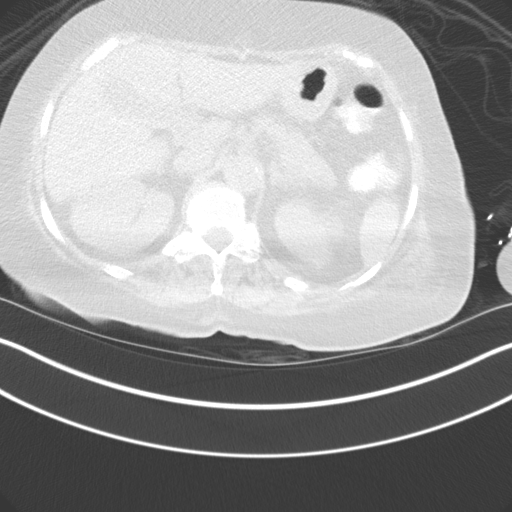
[im 9/59  lung]
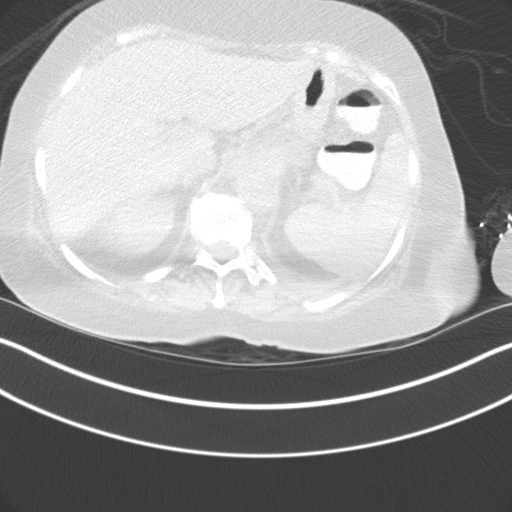
[im 13/59  lung]
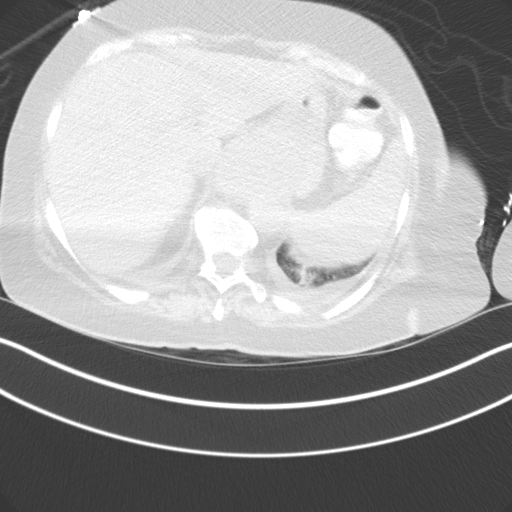
[im 18/59  lung]
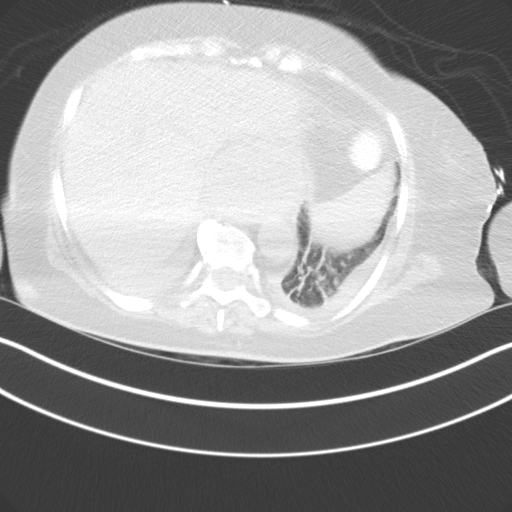
[im 22/59  mediastinal]
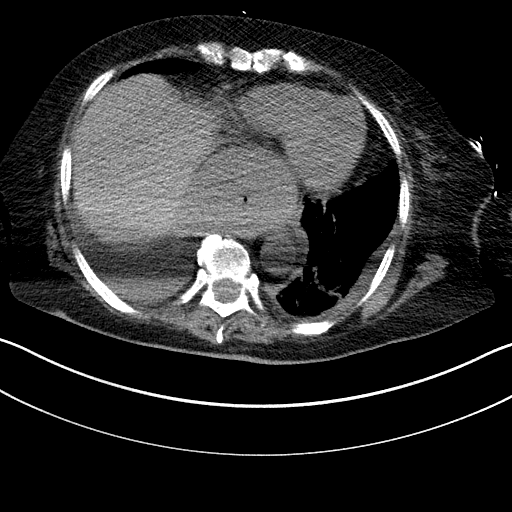
[im 22/59  lung]
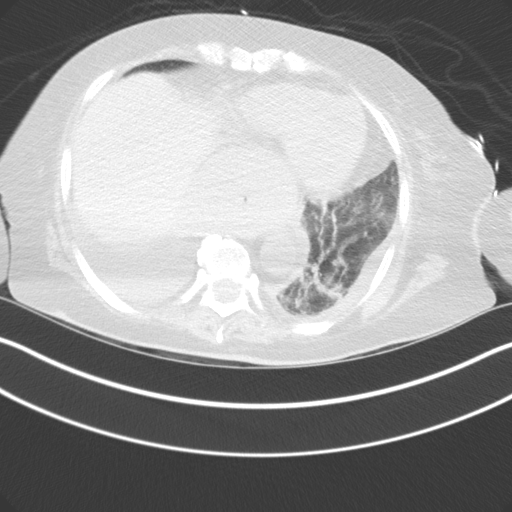
[im 26/59  lung]
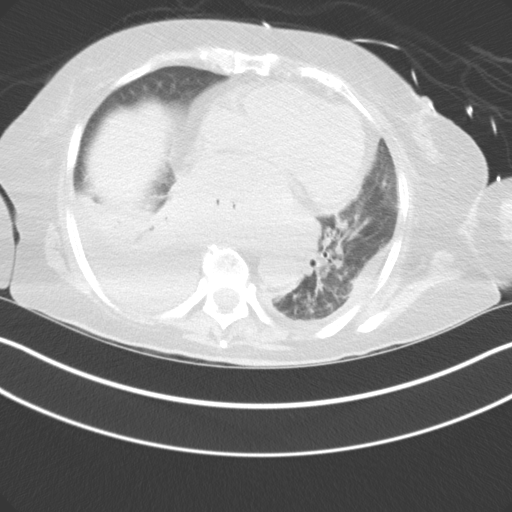
[im 33/59  lung]
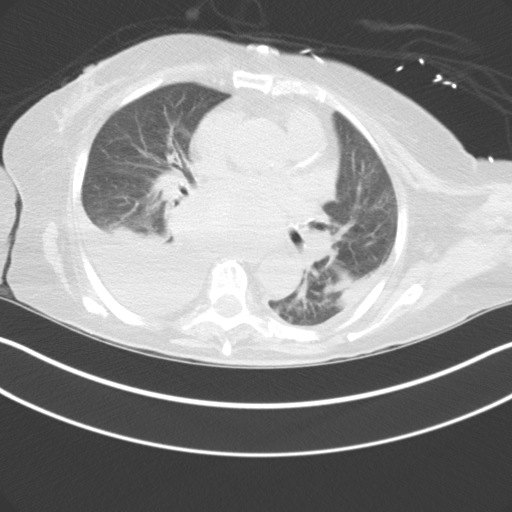
[im 37/59  lung]
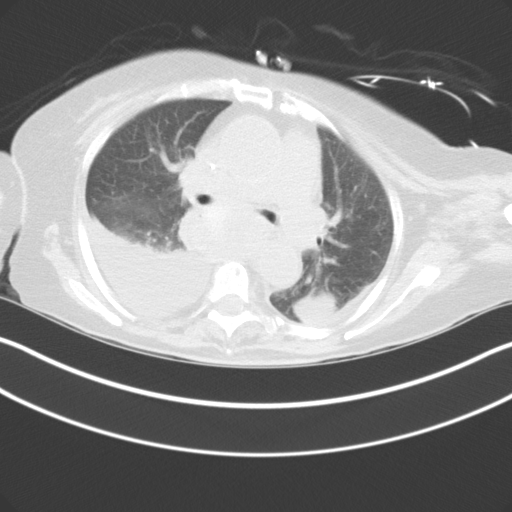
[im 41/59  mediastinal]
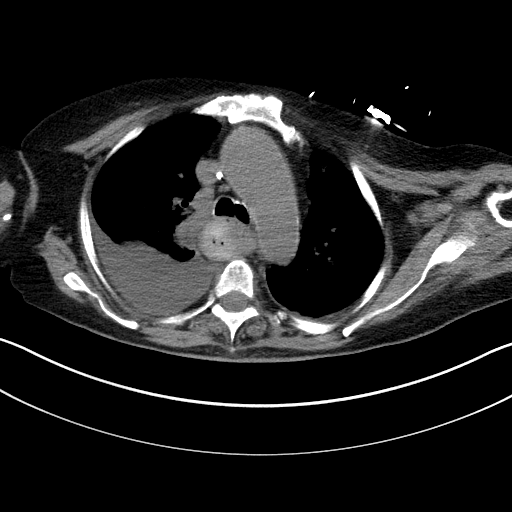
[im 41/59  lung]
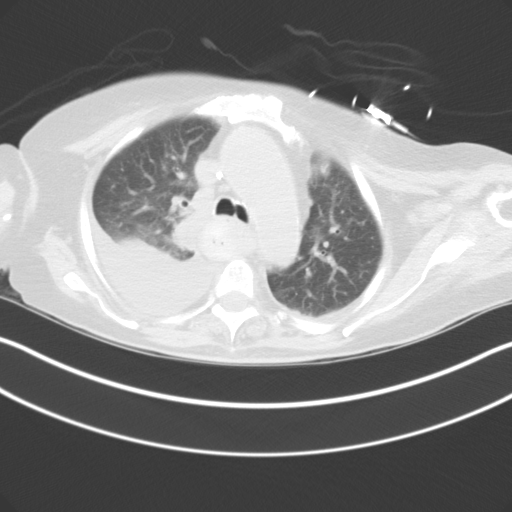
[im 46/59  lung]
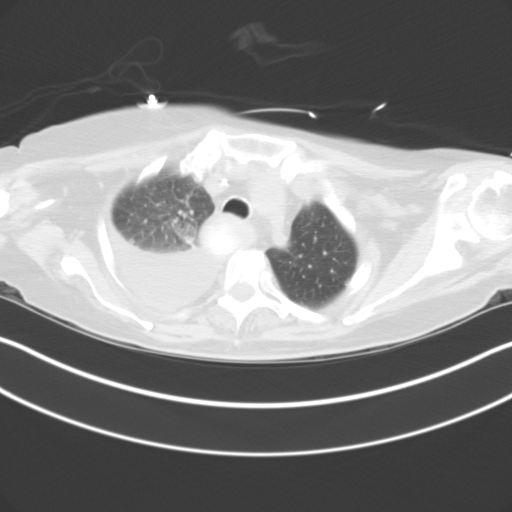
[im 50/59  lung]
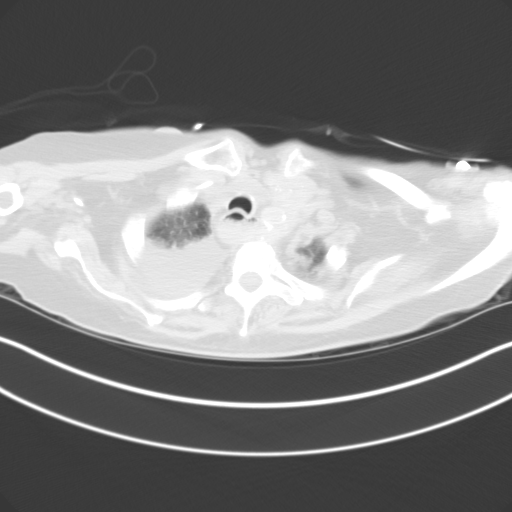
[im 54/59  lung]
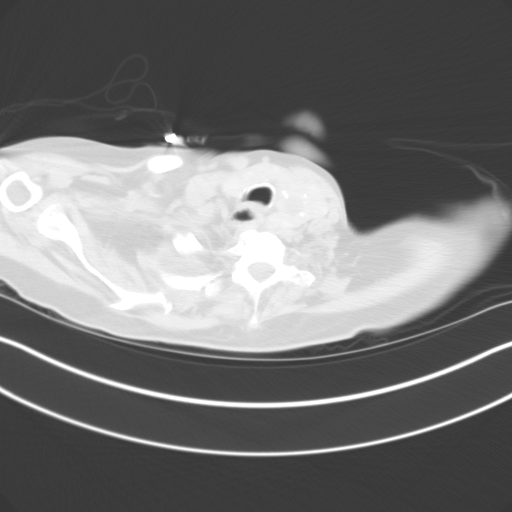

[Series 5: coronal · coronal · 0.59mm/px · 3 of 71 slices shown]
[im 15/71  lung]
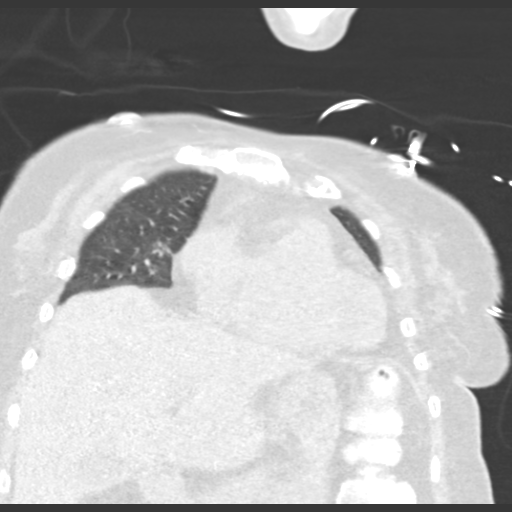
[im 29/71  lung]
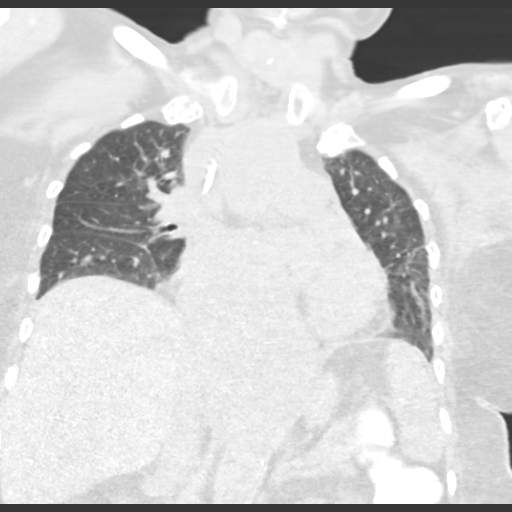
[im 43/71  lung]
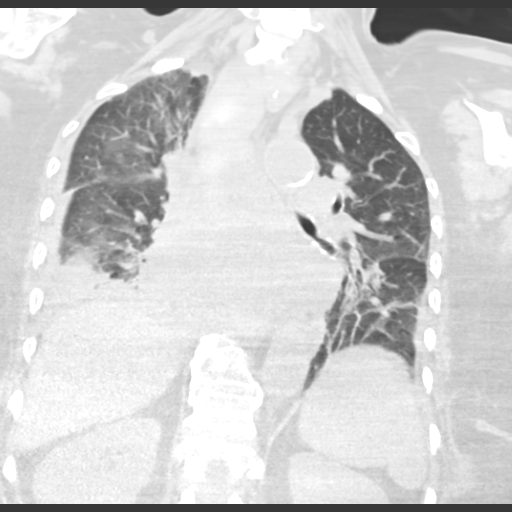

[15 of 36 positions shown; findings below may reference images not displayed]

DG CHEST 1V PORT dated
11/15/2013; CT ABD/PELVIS W CM dated 11/03/2013; CT ABD/PELVIS W CM
dated 10/29/2013
FINDINGS: There is some contrast material within the proximal esophageal lumen
which is mildly distended by the large distal esophageal mass. The
mass itself is not well delineated by this noncontrast examination.
The esophagus measures up to 10.5 x 6.4 cm transverse on image 33.
There is mass effect on the left atrium, although no clear cardiac
involvement. There is mild splaying of the carina. The superior
mediastinal fat planes are maintained. No discrete enlarged
mediastinal or hilar lymph nodes are identified.

Right arm PICC extends to the mid SVC. There is mild diffuse
atherosclerosis of the aorta, great vessels and coronary arteries.
There are multiple calcifications throughout the left thyroid lobe
associated with a central low density 1.4 cm lesion on image 6. The
left lobe is diffusely enlarged.

There are moderate right and small left pleural effusions. The left
pleural effusion extends into the superior aspect of the major
fissure. No significant pericardial fluid is present.

There is scattered atelectasis in both lungs, primarily adjacent to
the pleural effusions. A 3 mm perifissural nodule is noted in the
right upper lobe on image 24. No other pulmonary nodules are
identified.

The visualized upper abdomen has a stable appearance. Patient has a
G-tube. There are no worrisome osseous findings.
IMPRESSION: 1. The large distal esophageal mass is not well characterized by
this noncontrast study, although demonstrates no definite extra
esophageal extension based on this or the prior abdominal CTs. There
is extrinsic mass effect on the left atrium.
2. No distant metastases identified.
3. Persistent bilateral pleural effusions with scattered atelectasis
in both lungs. A small right upper lobe perifissural nodule is
nonspecific.
4. Mild diffuse atherosclerosis.
5. Asymmetric enlargement of the left thyroid lobe with
calcifications and a low-density nodule. Consider further evaluation
with thyroid ultrasound.

## 2014-04-12 IMAGING — CR DG CHEST 1V PORT
1 series · 1 of 1 positions shown · non-contrast
Comparison: CT CHEST W/O CM dated 11/20/2013

CLINICAL DATA: Pneumonia.  Shortness of breath.

EXAM:
PORTABLE CHEST - 1 VIEW

[AP]
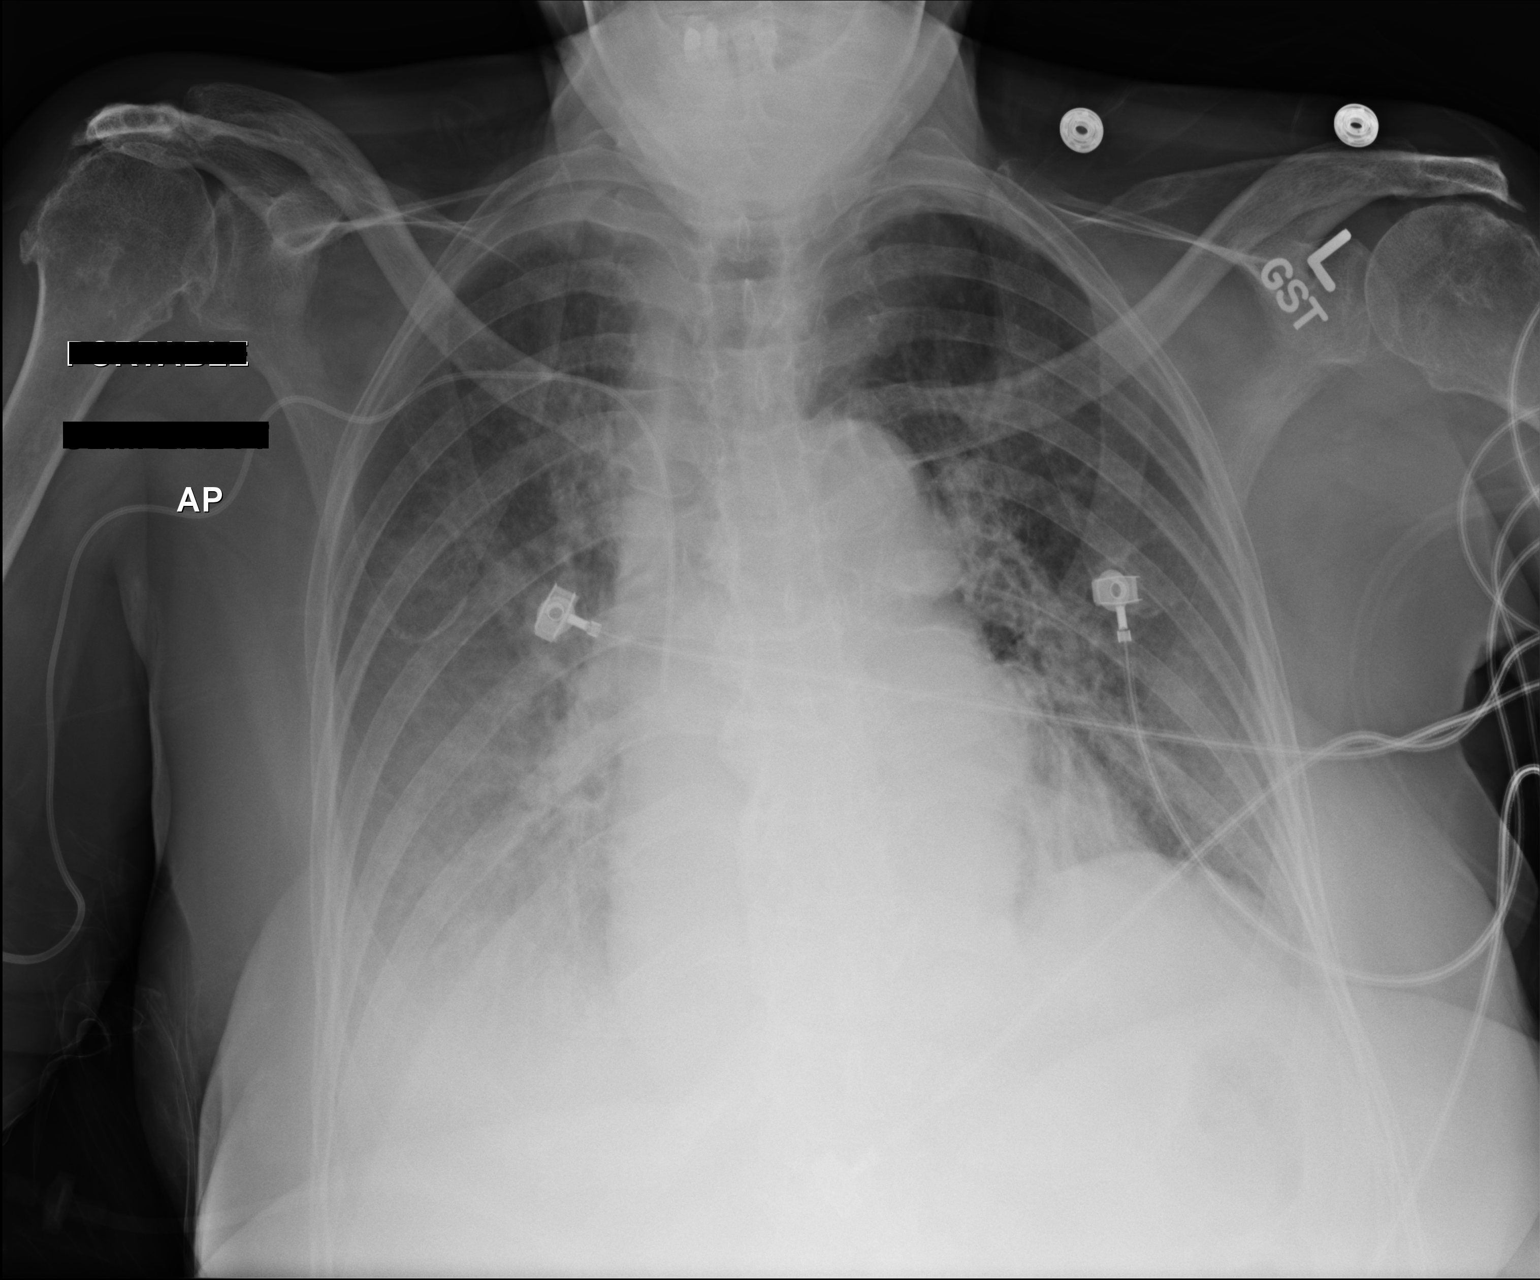

[1 of 1 positions shown; findings below may reference images not displayed]

FINDINGS: Central line in stable position. The mediastinum is normal.
Cardiomegaly with progressive bilateral pulmonary interstitial
prominence. These findings are consistent with congestive heart
failure and pulmonary interstitial edema. Developing alveolar edema
cannot be excluded. Right pleural effusion cannot be excluded. No
pneumothorax. Retrocardiac density is consistent with distended
esophagus, reference is made to prior CT report . No acute osseous
abnormality. Degenerative changes both shoulders.
IMPRESSION: 1. Findings consistent with congestive heart failure with
progressive pulmonary interstitial edema and probable right pleural
effusion.

2. Prominent retrocardiac density consistent with distended
esophagus, reference made to chest CT report 11/20/2013.

## 2014-04-13 IMAGING — XA IR CONVERT G-TUBE TO G-JTUBE
1 series · 8 of 8 positions shown · non-contrast
Comparison: none

CLINICAL DATA: High gastric residuals. Needs gastrostomy tube
converted to a gastrojejunal feeding tube.

[Series 1: run · 8 of 8 slices shown]
[im 1/8]
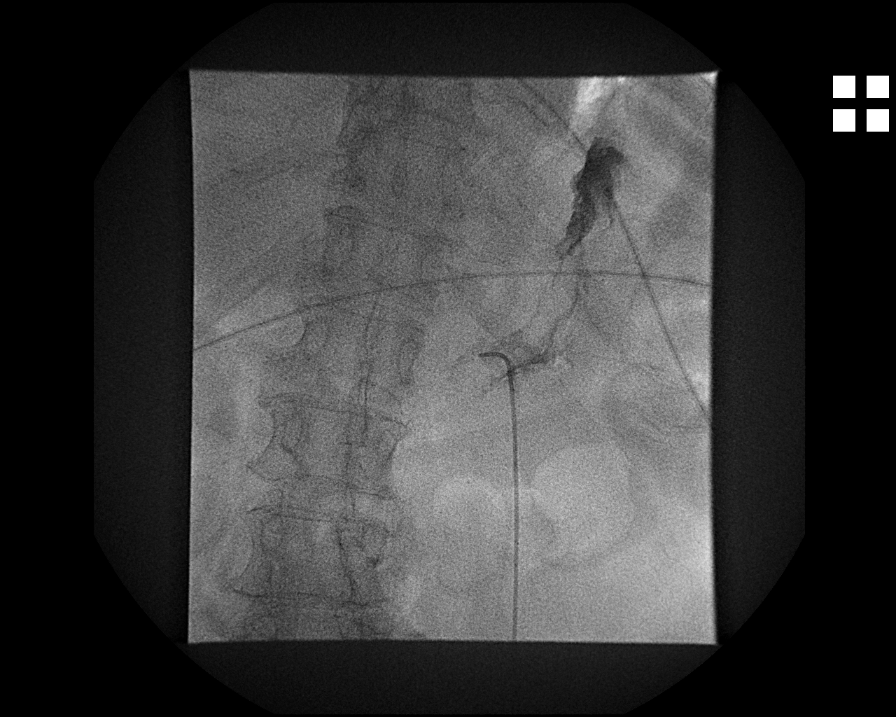
[im 2/8]
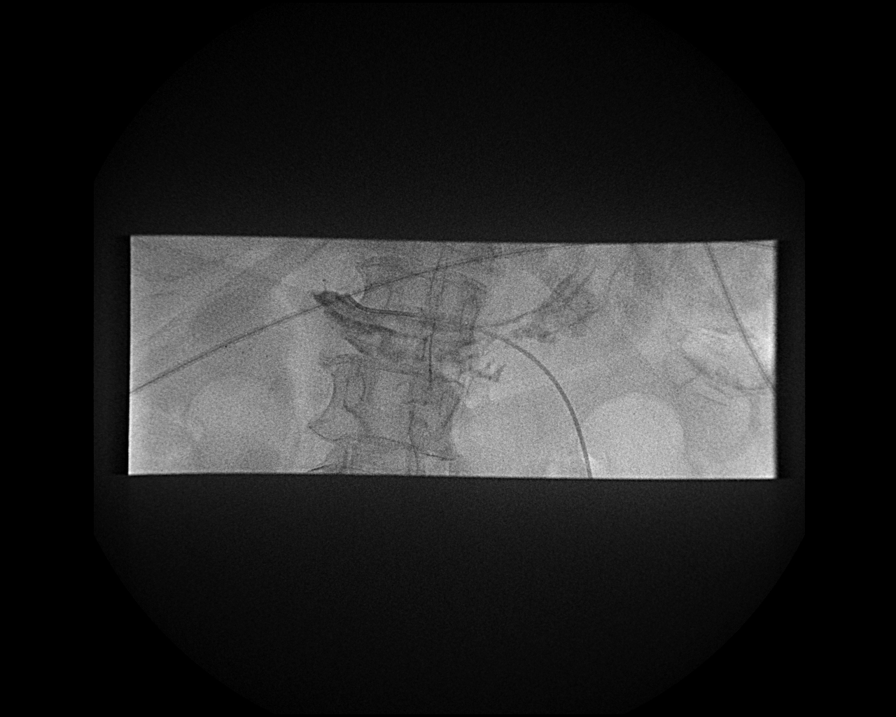
[im 3/8]
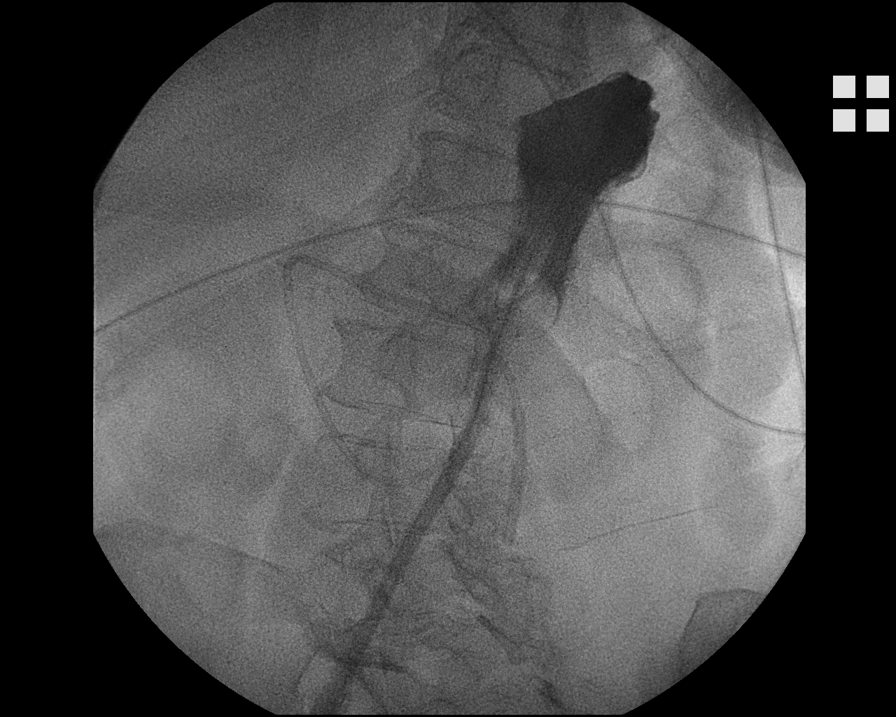
[im 4/8]
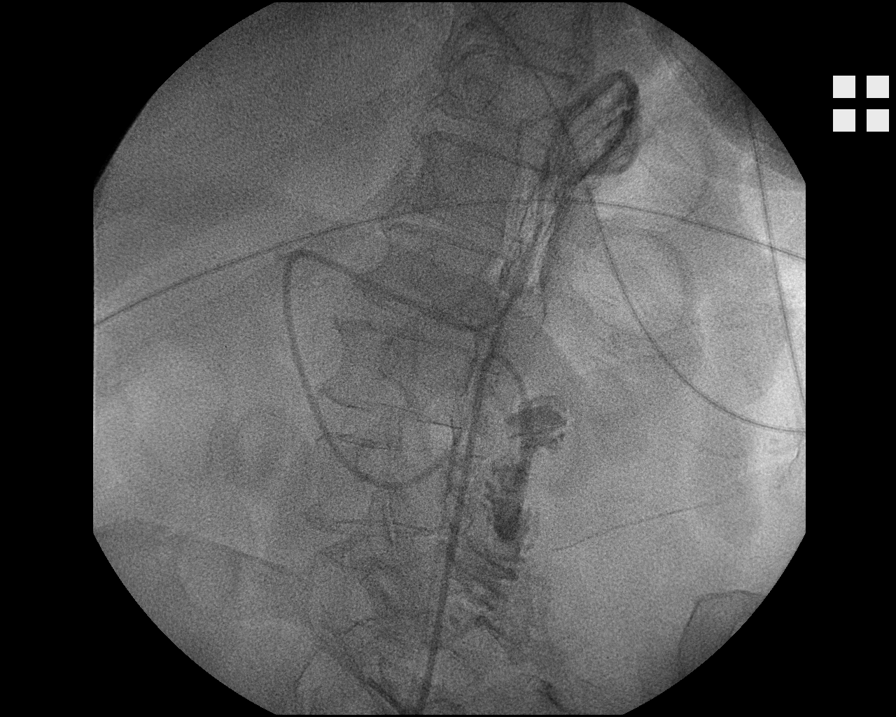
[im 5/8]
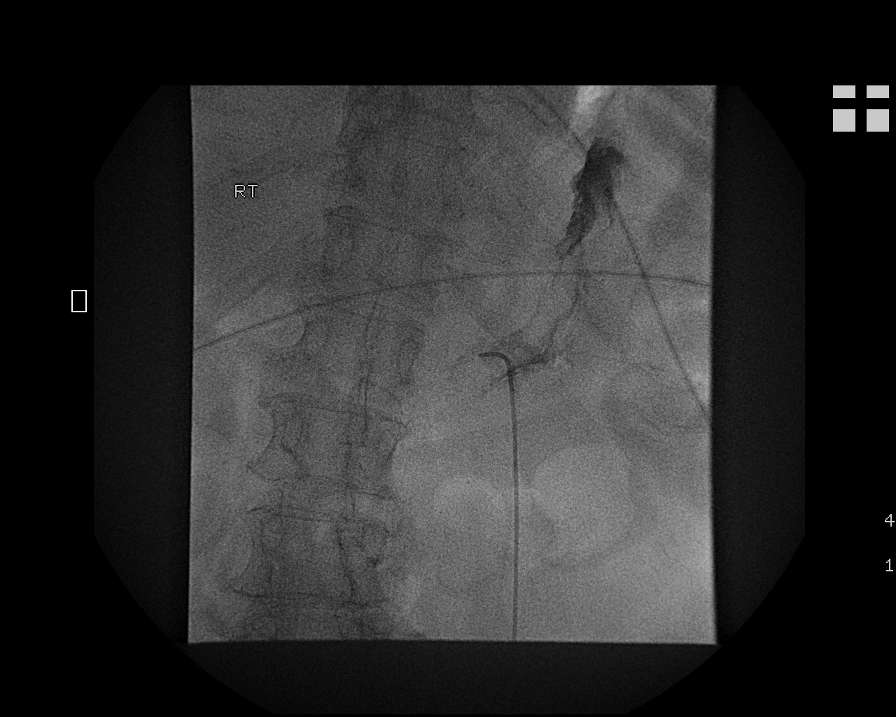
[im 6/8]
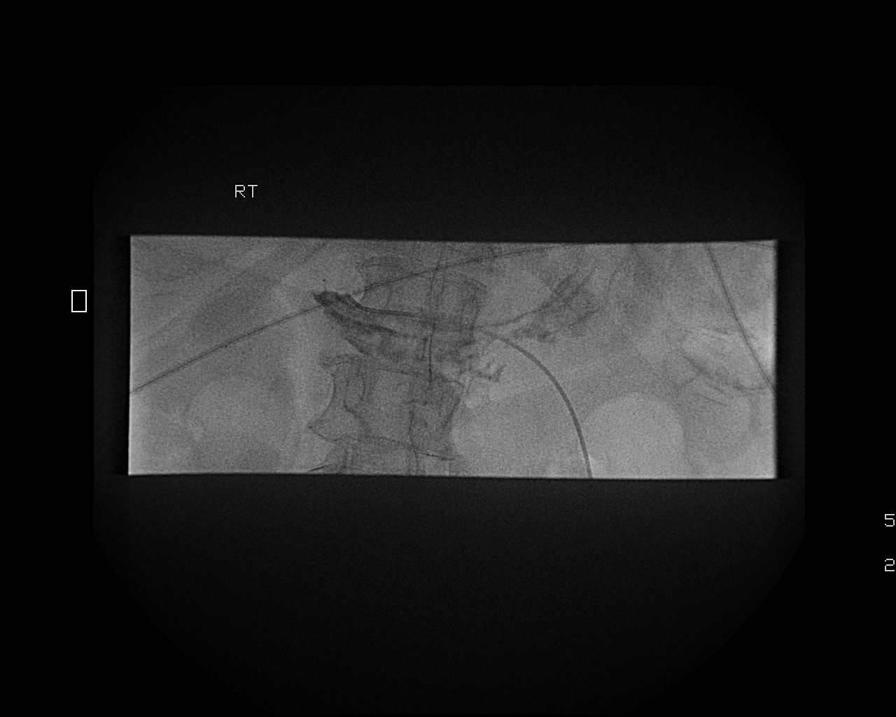
[im 7/8]
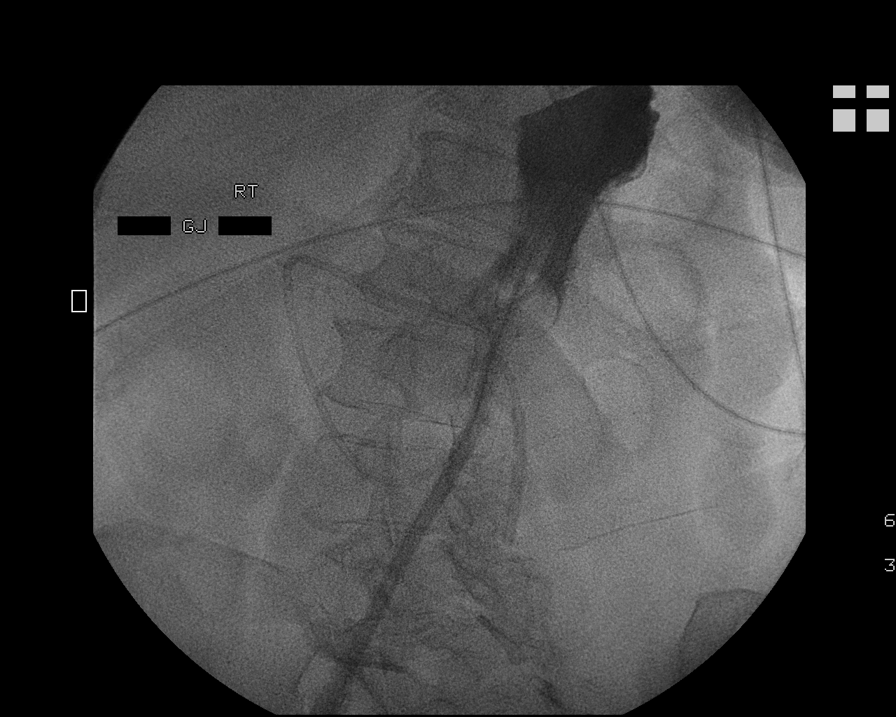
[im 8/8]
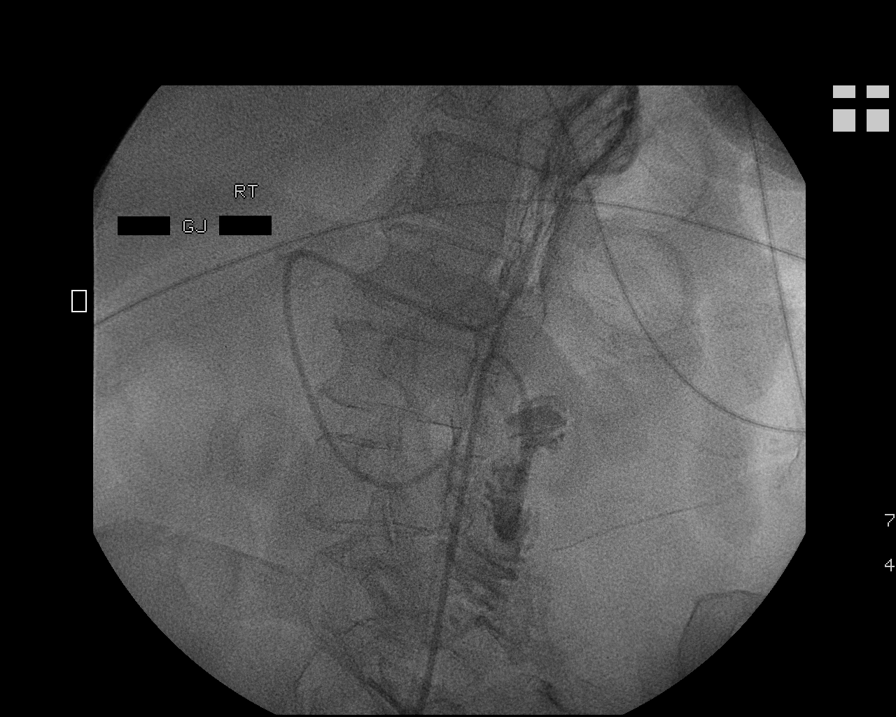

[8 of 8 positions shown; findings below may reference images not displayed]

EXAM:
CONVERSION OF GASTROSTOMY TUBE TO A GASTROJEJUNAL FEEDING TUBE

MEDICATIONS:
None

ANESTHESIA/SEDATION:
Moderate sedation time: None

FLUOROSCOPY TIME:  15 min and 6 seconds

PROCEDURE:
Informed consent was obtained for this procedure. The existing
gastrostomy and surrounding skin were prepped and draped in sterile
fashion. Maximal barrier sterile technique was utilized including
caps, mask, sterile gowns, sterile gloves, sterile drape, hand
hygiene and skin antiseptic. The gastrostomy tube balloon was
deflated and the gastrostomy tube was removed. A 5 French catheter
was advanced into the stomach. A C2 catheter was used to cannulate
the duodenum bulb. No significant contrast or air was draining into
the small bowel. Eventually, a Glidewire was advanced into the
descending duodenum and distal duodenum. Placement of the 24 French
GJ tube was very difficult because the wire repeatedly coiled in the
stomach and came out of the small bowel. Eventually, the tube was
advanced into the proximal jejunum. The retention balloon was
inflated with 8 mL of saline. Contrast injection confirmed placement
in the small bowel.

COMPLICATIONS:
None
FINDINGS: Catheter tip in the proximal jejunum.
IMPRESSION: Successful conversion of the gastrostomy tube to a gastrojejunal
feeding tube.
